# Patient Record
Sex: Female | Born: 1980 | Race: White | Hispanic: No | Smoking: Current every day smoker
Health system: Southern US, Community
[De-identification: ages and names within clinical notes are randomized; demographics above are authoritative.]

## PROBLEM LIST (undated history)

## (undated) DIAGNOSIS — M549 Dorsalgia, unspecified: Secondary | ICD-10-CM

## (undated) DIAGNOSIS — F419 Anxiety disorder, unspecified: Secondary | ICD-10-CM

## (undated) DIAGNOSIS — F319 Bipolar disorder, unspecified: Secondary | ICD-10-CM

## (undated) DIAGNOSIS — F329 Major depressive disorder, single episode, unspecified: Secondary | ICD-10-CM

## (undated) DIAGNOSIS — F32A Depression, unspecified: Secondary | ICD-10-CM

## (undated) DIAGNOSIS — E049 Nontoxic goiter, unspecified: Secondary | ICD-10-CM

## (undated) DIAGNOSIS — T1490XA Injury, unspecified, initial encounter: Secondary | ICD-10-CM

## (undated) DIAGNOSIS — M5126 Other intervertebral disc displacement, lumbar region: Secondary | ICD-10-CM

## (undated) DIAGNOSIS — Z3492 Encounter for supervision of normal pregnancy, unspecified, second trimester: Secondary | ICD-10-CM

## (undated) DIAGNOSIS — Z3009 Encounter for other general counseling and advice on contraception: Secondary | ICD-10-CM

## (undated) DIAGNOSIS — M797 Fibromyalgia: Secondary | ICD-10-CM

## (undated) DIAGNOSIS — M1990 Unspecified osteoarthritis, unspecified site: Secondary | ICD-10-CM

## (undated) HISTORY — DX: Nontoxic goiter, unspecified: E04.9

## (undated) HISTORY — DX: Encounter for supervision of normal pregnancy, unspecified, second trimester: Z34.92

## (undated) HISTORY — DX: Other intervertebral disc displacement, lumbar region: M51.26

## (undated) HISTORY — PX: TONSILLECTOMY: SUR1361

## (undated) HISTORY — DX: Dorsalgia, unspecified: M54.9

## (undated) HISTORY — DX: Injury, unspecified, initial encounter: T14.90XA

## (undated) HISTORY — DX: Major depressive disorder, single episode, unspecified: F32.9

## (undated) HISTORY — DX: Fibromyalgia: M79.7

## (undated) HISTORY — DX: Depression, unspecified: F32.A

## (undated) HISTORY — DX: Anxiety disorder, unspecified: F41.9

## (undated) HISTORY — DX: Encounter for other general counseling and advice on contraception: Z30.09

## (undated) HISTORY — DX: Bipolar disorder, unspecified: F31.9

---

## 2003-03-01 ENCOUNTER — Emergency Department (HOSPITAL_COMMUNITY): Admission: EM | Admit: 2003-03-01 | Discharge: 2003-03-01 | Payer: Self-pay | Admitting: Emergency Medicine

## 2003-04-05 ENCOUNTER — Emergency Department (HOSPITAL_COMMUNITY): Admission: EM | Admit: 2003-04-05 | Discharge: 2003-04-05 | Payer: Self-pay | Admitting: Emergency Medicine

## 2003-11-16 ENCOUNTER — Emergency Department (HOSPITAL_COMMUNITY): Admission: EM | Admit: 2003-11-16 | Discharge: 2003-11-16 | Payer: Self-pay | Admitting: Emergency Medicine

## 2004-05-21 ENCOUNTER — Emergency Department (HOSPITAL_COMMUNITY): Admission: EM | Admit: 2004-05-21 | Discharge: 2004-05-21 | Payer: Self-pay | Admitting: Emergency Medicine

## 2004-09-29 ENCOUNTER — Ambulatory Visit (HOSPITAL_COMMUNITY): Admission: RE | Admit: 2004-09-29 | Discharge: 2004-09-29 | Payer: Self-pay | Admitting: *Deleted

## 2005-04-03 ENCOUNTER — Emergency Department (HOSPITAL_COMMUNITY): Admission: EM | Admit: 2005-04-03 | Discharge: 2005-04-03 | Payer: Self-pay | Admitting: Emergency Medicine

## 2006-06-28 ENCOUNTER — Emergency Department (HOSPITAL_COMMUNITY): Admission: EM | Admit: 2006-06-28 | Discharge: 2006-06-29 | Payer: Self-pay | Admitting: Emergency Medicine

## 2006-10-14 ENCOUNTER — Emergency Department (HOSPITAL_COMMUNITY): Admission: EM | Admit: 2006-10-14 | Discharge: 2006-10-14 | Payer: Self-pay | Admitting: Emergency Medicine

## 2007-02-21 ENCOUNTER — Emergency Department (HOSPITAL_COMMUNITY): Admission: EM | Admit: 2007-02-21 | Discharge: 2007-02-21 | Payer: Self-pay | Admitting: Emergency Medicine

## 2007-06-24 ENCOUNTER — Emergency Department (HOSPITAL_COMMUNITY): Admission: EM | Admit: 2007-06-24 | Discharge: 2007-06-24 | Payer: Self-pay | Admitting: *Deleted

## 2007-10-09 ENCOUNTER — Emergency Department (HOSPITAL_COMMUNITY): Admission: EM | Admit: 2007-10-09 | Discharge: 2007-10-09 | Payer: Self-pay | Admitting: Emergency Medicine

## 2008-02-19 ENCOUNTER — Emergency Department (HOSPITAL_COMMUNITY): Admission: EM | Admit: 2008-02-19 | Discharge: 2008-02-20 | Payer: Self-pay | Admitting: Emergency Medicine

## 2008-05-08 ENCOUNTER — Encounter: Admission: RE | Admit: 2008-05-08 | Discharge: 2008-07-06 | Payer: Self-pay | Admitting: Orthopedic Surgery

## 2008-05-30 ENCOUNTER — Emergency Department (HOSPITAL_COMMUNITY): Admission: EM | Admit: 2008-05-30 | Discharge: 2008-05-30 | Payer: Self-pay | Admitting: Emergency Medicine

## 2008-09-08 ENCOUNTER — Emergency Department (HOSPITAL_COMMUNITY): Admission: EM | Admit: 2008-09-08 | Discharge: 2008-09-08 | Payer: Self-pay | Admitting: Emergency Medicine

## 2008-09-11 ENCOUNTER — Emergency Department (HOSPITAL_COMMUNITY): Admission: EM | Admit: 2008-09-11 | Discharge: 2008-09-11 | Payer: Self-pay | Admitting: Emergency Medicine

## 2009-10-19 DIAGNOSIS — T1490XA Injury, unspecified, initial encounter: Secondary | ICD-10-CM

## 2009-10-19 HISTORY — DX: Injury, unspecified, initial encounter: T14.90XA

## 2009-11-06 ENCOUNTER — Emergency Department (HOSPITAL_COMMUNITY): Admission: EM | Admit: 2009-11-06 | Discharge: 2009-11-06 | Payer: Self-pay | Admitting: Emergency Medicine

## 2009-11-09 ENCOUNTER — Encounter (HOSPITAL_COMMUNITY): Admission: RE | Admit: 2009-11-09 | Discharge: 2009-12-09 | Payer: Self-pay | Admitting: Orthopaedic Surgery

## 2009-12-01 ENCOUNTER — Ambulatory Visit (HOSPITAL_COMMUNITY): Admission: RE | Admit: 2009-12-01 | Discharge: 2009-12-01 | Payer: Self-pay | Admitting: Family Medicine

## 2010-03-08 ENCOUNTER — Encounter (HOSPITAL_COMMUNITY)
Admission: RE | Admit: 2010-03-08 | Discharge: 2010-04-07 | Payer: Self-pay | Source: Home / Self Care | Admitting: Orthopaedic Surgery

## 2010-09-05 ENCOUNTER — Ambulatory Visit (HOSPITAL_COMMUNITY)
Admission: RE | Admit: 2010-09-05 | Discharge: 2010-09-05 | Payer: Self-pay | Source: Home / Self Care | Attending: Family Medicine | Admitting: Family Medicine

## 2011-04-15 ENCOUNTER — Emergency Department (HOSPITAL_COMMUNITY)

## 2011-04-15 ENCOUNTER — Emergency Department (HOSPITAL_COMMUNITY)
Admission: EM | Admit: 2011-04-15 | Discharge: 2011-04-15 | Disposition: A | Discharge status: Home / Self Care | Attending: Emergency Medicine | Admitting: Emergency Medicine

## 2011-04-15 ENCOUNTER — Encounter: Payer: Self-pay | Admitting: Emergency Medicine

## 2011-04-15 DIAGNOSIS — F17200 Nicotine dependence, unspecified, uncomplicated: Secondary | ICD-10-CM | POA: Insufficient documentation

## 2011-04-15 DIAGNOSIS — S93409A Sprain of unspecified ligament of unspecified ankle, initial encounter: Secondary | ICD-10-CM

## 2011-04-15 DIAGNOSIS — X500XXA Overexertion from strenuous movement or load, initial encounter: Secondary | ICD-10-CM | POA: Insufficient documentation

## 2011-04-15 HISTORY — DX: Unspecified osteoarthritis, unspecified site: M19.90

## 2011-04-15 NOTE — ED Provider Notes (Signed)
Evaluation and management procedures were performed by the mid-level provider (PA/NP/CNM) under my supervision/collaboration. I was present and available during the ED course.  Gavin Pound. Oletta Lamas, MD 04/15/11 2046

## 2011-04-15 NOTE — ED Provider Notes (Signed)
History     CSN: [id number] Arrival date & time: 04/15/2011  6:25 PM  Chief Complaint  Patient presents with  . Ankle Pain   Patient is a 30 y.o. female presenting with ankle pain. The history is provided by the patient. No language interpreter was used.  Ankle Pain  The incident occurred 3 to 5 hours ago. Incident location: at a park. Injury mechanism: inversion. The pain is present in the right ankle. The quality of the pain is described as sharp. The pain is moderate. The pain has been constant since onset. Pertinent negatives include no inability to bear weight.    Past Medical History  Diagnosis Date  . Arthritis     Past Surgical History  Procedure Date  . Tonsillectomy     Family History  Problem Relation Age of Onset  . Osteoarthritis Mother   . Heart failure Mother   . Hyperlipidemia Mother   . Hypertension Mother   . Migraines Mother   . Emphysema Other   . Cancer Other   . Diabetes Other     History  Substance Use Topics  . Smoking status: Current Everyday Smoker -- 1.5 packs/day    Types: Cigarettes  . Smokeless tobacco: Never Used  . Alcohol Use: No    OB History    Grav Para Term Preterm Abortions TAB SAB Ect Mult Living   3 2 2  1 1    2       Review of Systems  Musculoskeletal:       Ankle pain    Physical Exam  BP 102/61  Pulse 89  Temp(Src) 98.2 F (36.8 C) (Oral)  Resp 14  Ht 5\' 4"  (1.626 m)  Wt 130 lb 1.6 oz (59.013 kg)  BMI 22.33 kg/m2  SpO2 100%  LMP 03/27/2011  Physical Exam  Nursing note and vitals reviewed. Constitutional: She is oriented to person, place, and time. Vital signs are normal. She appears well-developed and well-nourished. No distress.  HENT:  Head: Normocephalic and atraumatic.  Right Ear: External ear normal.  Left Ear: External ear normal.  Nose: Nose normal.  Mouth/Throat: No oropharyngeal exudate.  Eyes: Conjunctivae and EOM are normal. Pupils are equal, round, and reactive to light. Right eye  exhibits no discharge. Left eye exhibits no discharge. No scleral icterus.  Neck: Normal range of motion. Neck supple. No JVD present. No tracheal deviation present. No thyromegaly present.  Cardiovascular: Normal rate, regular rhythm, normal heart sounds, intact distal pulses and normal pulses.  Exam reveals no gallop and no friction rub.   No murmur heard. Pulmonary/Chest: Effort normal and breath sounds normal. No stridor. No respiratory distress. She has no wheezes. She has no rales. She exhibits no tenderness.  Abdominal: Soft. Normal appearance and bowel sounds are normal. She exhibits no distension and no mass. There is no tenderness. There is no rebound and no guarding.  Musculoskeletal: Normal range of motion. She exhibits tenderness. She exhibits no edema.       Feet:  Lymphadenopathy:    She has no cervical adenopathy.  Neurological: She is alert and oriented to person, place, and time. She has normal reflexes. No cranial nerve deficit. Coordination normal. GCS eye subscore is 4. GCS verbal subscore is 5. GCS motor subscore is 6.  Skin: Skin is warm and dry. No rash noted. She is not diaphoretic.  Psychiatric: She has a normal mood and affect. Her speech is normal and behavior is normal. Judgment and thought content normal. Cognition  and memory are normal.    ED Course  Procedures  MDM       Worthy Rancher, [place] 04/15/11 2039
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
## 2011-04-15 NOTE — ED Notes (Signed)
ASo ankle splint applied to R ankle

## 2011-05-12 LAB — URINALYSIS, ROUTINE W REFLEX MICROSCOPIC
Glucose, UA: NEGATIVE
Ketones, ur: NEGATIVE
Nitrite: NEGATIVE
Protein, ur: NEGATIVE

## 2011-05-12 LAB — RAPID STREP SCREEN (MED CTR MEBANE ONLY): Streptococcus, Group A Screen (Direct): NEGATIVE

## 2011-05-12 LAB — INFLUENZA A+B VIRUS AG-DIRECT(RAPID)
Inflenza A Ag: NEGATIVE
Influenza B Ag: NEGATIVE

## 2011-05-18 LAB — URINALYSIS, ROUTINE W REFLEX MICROSCOPIC
Bilirubin Urine: NEGATIVE
Glucose, UA: NEGATIVE
Hgb urine dipstick: NEGATIVE
Specific Gravity, Urine: 1.009
pH: 7

## 2011-10-11 ENCOUNTER — Ambulatory Visit (INDEPENDENT_AMBULATORY_CARE_PROVIDER_SITE_OTHER): Admitting: Psychiatry

## 2011-10-11 ENCOUNTER — Encounter (HOSPITAL_COMMUNITY): Payer: Self-pay | Admitting: Psychiatry

## 2011-10-11 VITALS — BP 114/62 | HR 80 | Wt 130.4 lb

## 2011-10-11 DIAGNOSIS — F3289 Other specified depressive episodes: Secondary | ICD-10-CM

## 2011-10-11 DIAGNOSIS — F32A Depression, unspecified: Secondary | ICD-10-CM | POA: Insufficient documentation

## 2011-10-11 DIAGNOSIS — F329 Major depressive disorder, single episode, unspecified: Secondary | ICD-10-CM | POA: Insufficient documentation

## 2011-10-11 MED ORDER — FLUOXETINE HCL 10 MG PO CAPS: 10.0000 mg | ORAL_CAPSULE | Freq: Every day | ORAL | Status: DC
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
## 2011-10-17 ENCOUNTER — Encounter (HOSPITAL_COMMUNITY): Payer: Self-pay | Admitting: Licensed Clinical Social Worker

## 2011-10-17 ENCOUNTER — Ambulatory Visit (INDEPENDENT_AMBULATORY_CARE_PROVIDER_SITE_OTHER): Admitting: Licensed Clinical Social Worker

## 2011-10-17 DIAGNOSIS — F332 Major depressive disorder, recurrent severe without psychotic features: Secondary | ICD-10-CM

## 2011-10-18 ENCOUNTER — Encounter (HOSPITAL_COMMUNITY): Payer: Self-pay | Admitting: Licensed Clinical Social Worker
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
## 2011-10-24 ENCOUNTER — Ambulatory Visit (INDEPENDENT_AMBULATORY_CARE_PROVIDER_SITE_OTHER): Admitting: Licensed Clinical Social Worker

## 2011-10-24 DIAGNOSIS — F332 Major depressive disorder, recurrent severe without psychotic features: Secondary | ICD-10-CM
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
## 2011-10-27 ENCOUNTER — Ambulatory Visit (INDEPENDENT_AMBULATORY_CARE_PROVIDER_SITE_OTHER): Admitting: Psychiatry

## 2011-10-27 ENCOUNTER — Encounter (HOSPITAL_COMMUNITY): Payer: Self-pay | Admitting: Psychiatry

## 2011-10-27 VITALS — Wt 134.0 lb

## 2011-10-27 DIAGNOSIS — F419 Anxiety disorder, unspecified: Secondary | ICD-10-CM

## 2011-10-27 DIAGNOSIS — F329 Major depressive disorder, single episode, unspecified: Secondary | ICD-10-CM

## 2011-10-27 DIAGNOSIS — F411 Generalized anxiety disorder: Secondary | ICD-10-CM

## 2011-10-27 MED ORDER — FLUOXETINE HCL 10 MG PO CAPS: 10.0000 mg | ORAL_CAPSULE | Freq: Every day | ORAL | Status: DC

## 2011-10-27 MED ORDER — LORAZEPAM 0.5 MG PO TABS: 0.5000 mg | ORAL_TABLET | ORAL | Status: DC | PRN
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
## 2011-10-30 ENCOUNTER — Encounter (HOSPITAL_COMMUNITY): Payer: Self-pay | Admitting: Licensed Clinical Social Worker

## 2011-10-30 ENCOUNTER — Ambulatory Visit (INDEPENDENT_AMBULATORY_CARE_PROVIDER_SITE_OTHER): Admitting: Licensed Clinical Social Worker

## 2011-10-30 DIAGNOSIS — F332 Major depressive disorder, recurrent severe without psychotic features: Secondary | ICD-10-CM

## 2011-10-30 DIAGNOSIS — F410 Panic disorder [episodic paroxysmal anxiety] without agoraphobia: Secondary | ICD-10-CM

## 2011-10-30 DIAGNOSIS — F411 Generalized anxiety disorder: Secondary | ICD-10-CM

## 2011-10-30 NOTE — Progress Notes (Signed)
   THERAPIST PROGRESS NOTE  Session Time: 9:30am-10:20am  Participation Level: Active  Behavioral Response: Well GroomedAlertAnxious  Type of Therapy: Individual Therapy  Treatment Goals addressed: Anxiety and Coping  Interventions: CBT, DBT, Strength-based and Supportive  Summary: Gina Barron is a 30 y.o. female who presents with anxious mood and affect. She reports having a stressful week, which she says is the norm for her right now. She describes frequent panic attacks and reports that when she has them in her home, she locks herself in a room and smokes to deal with the stress. When she is driving, she pulls over and when she is out in public in a store, she immediately leaves. She has obssessive and racing thoughts and describes OCD behaviors such as checking locks on doors, windows and knobs on the stove each night. She reports that these symptoms have been around "for a long time" and she has adapted to them. She must clean in a very specific order and if she is interrupted, she must start at the beginning again, even if she has already cleaned those rooms. Her anxiety causes her the most difficulty and has been generalized historically, but is now focused on the situation with her family. Her sleep is slowly improving and her appetite is wnl.    Suicidal/Homicidal: Nowithout intent/plan  Therapist Response: Processed w/pt her feelings related to her family situation. Assisted her with insight development into her triggers for panic attacks. She struggles to identify specific triggers. Used deep breathing techniques to decrease her anxiety and taught her to do this when she has a panic attack. Provided reframing for her irrational thought processes. Used CBT to assist patient with the identification of her negative distortions and irrational thoughts. Encouraged patient to verbalize alternative and factual responses which challenge her distortions. Encouraged journal keeping, but  she is still uncertain where she can keep the journal without her daughters finding it. Reviewed patients self care plan. Assessed her progress related to self care. Patient's self care is fair. Recommend proper diet, regular exercise, socialization and recreation.   Plan: Return again in one weeks.  Diagnosis: Axis I: Generalized Anxiety Disorder, Major Depression, Recurrent severe and Panic Disorder    Axis II: No diagnosis    Keaundre Thelin, LCSW 10/30/2011

## 2011-11-13 ENCOUNTER — Ambulatory Visit (INDEPENDENT_AMBULATORY_CARE_PROVIDER_SITE_OTHER): Admitting: Licensed Clinical Social Worker

## 2011-11-13 ENCOUNTER — Encounter (HOSPITAL_COMMUNITY): Payer: Self-pay | Admitting: Licensed Clinical Social Worker

## 2011-11-13 DIAGNOSIS — F411 Generalized anxiety disorder: Secondary | ICD-10-CM

## 2011-11-13 DIAGNOSIS — F332 Major depressive disorder, recurrent severe without psychotic features: Secondary | ICD-10-CM

## 2011-11-13 DIAGNOSIS — F319 Bipolar disorder, unspecified: Secondary | ICD-10-CM | POA: Insufficient documentation
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
## 2011-11-15 ENCOUNTER — Ambulatory Visit (INDEPENDENT_AMBULATORY_CARE_PROVIDER_SITE_OTHER): Admitting: Psychiatry

## 2011-11-15 ENCOUNTER — Encounter (HOSPITAL_COMMUNITY): Payer: Self-pay | Admitting: Psychiatry

## 2011-11-15 VITALS — BP 116/71 | HR 75 | Wt 136.0 lb

## 2011-11-15 DIAGNOSIS — F419 Anxiety disorder, unspecified: Secondary | ICD-10-CM

## 2011-11-15 DIAGNOSIS — F411 Generalized anxiety disorder: Secondary | ICD-10-CM

## 2011-11-15 DIAGNOSIS — F329 Major depressive disorder, single episode, unspecified: Secondary | ICD-10-CM

## 2011-11-15 MED ORDER — LORAZEPAM 0.5 MG PO TABS: 0.5000 mg | ORAL_TABLET | ORAL | Status: DC | PRN

## 2011-11-15 MED ORDER — FLUOXETINE HCL 10 MG PO CAPS: 10.0000 mg | ORAL_CAPSULE | Freq: Two times a day (BID) | ORAL | Status: DC
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
## 2011-11-28 ENCOUNTER — Ambulatory Visit (INDEPENDENT_AMBULATORY_CARE_PROVIDER_SITE_OTHER): Admitting: Licensed Clinical Social Worker

## 2011-11-28 DIAGNOSIS — F329 Major depressive disorder, single episode, unspecified: Secondary | ICD-10-CM

## 2011-11-28 DIAGNOSIS — F411 Generalized anxiety disorder: Secondary | ICD-10-CM
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
## 2011-11-29 ENCOUNTER — Encounter (HOSPITAL_COMMUNITY): Payer: Self-pay | Admitting: *Deleted

## 2011-11-29 NOTE — Progress Notes (Signed)
Patient states anxiety and panic attacks are worse and medication is not helping as much.

## 2011-11-29 NOTE — Progress Notes (Signed)
Needs to be seen. Rec to increase prozac to 30 mg

## 2011-11-30 ENCOUNTER — Telehealth (HOSPITAL_COMMUNITY): Payer: Self-pay | Admitting: *Deleted

## 2011-11-30 NOTE — Telephone Encounter (Signed)
Instructed patient in message to increase Prozac 10 mg to 3 capsules daily and that she could take Ativan 0.5 mg twice daily if needed per Dr.Arfeen's orders.Instructed to call office as needed.Informed patient that will let Belenda Cruise know.

## 2011-12-01 ENCOUNTER — Encounter (HOSPITAL_COMMUNITY): Payer: Self-pay | Admitting: Emergency Medicine

## 2011-12-01 ENCOUNTER — Emergency Department (HOSPITAL_COMMUNITY)

## 2011-12-01 ENCOUNTER — Emergency Department (HOSPITAL_COMMUNITY)
Admission: EM | Admit: 2011-12-01 | Discharge: 2011-12-01 | Disposition: A | Discharge status: Home / Self Care | Attending: Emergency Medicine | Admitting: Emergency Medicine

## 2011-12-01 DIAGNOSIS — M6740 Ganglion, unspecified site: Secondary | ICD-10-CM

## 2011-12-01 DIAGNOSIS — M79609 Pain in unspecified limb: Secondary | ICD-10-CM | POA: Insufficient documentation

## 2011-12-01 DIAGNOSIS — F17200 Nicotine dependence, unspecified, uncomplicated: Secondary | ICD-10-CM | POA: Insufficient documentation

## 2011-12-01 NOTE — ED Notes (Signed)
Pt c/o left hand pain since Monday. Pt denies any injury to that hand.

## 2011-12-01 NOTE — ED Notes (Signed)
Pt states awoke with had swollen and painful with movement. Pt states has a past history of painful swelling. Pt has a noted swelling on top of left hand. Pt denies injury/trauma. Pt has self treated at home without success. X-ray completed and resulted.

## 2011-12-01 NOTE — ED Provider Notes (Signed)
Medical screening examination/treatment/procedure(s) were performed by non-physician practitioner and as supervising physician I was immediately available for consultation/collaboration.  Shelda Jakes, MD 12/01/11 [phone]

## 2011-12-01 NOTE — ED Provider Notes (Signed)
History     CSN: [id number]  Arrival date & time 12/01/11  1721   First MD Initiated Contact with Patient 12/01/11 1922      Chief Complaint  Patient presents with  . Hand Pain    (Consider location/radiation/quality/duration/timing/severity/associated sxs/prior treatment) HPI Comments: Waxing and waning of area on L dorsal wrist.  No known trauma.  Pain with movement of wrist.  Patient is a 31 y.o. female presenting with hand pain. The history is provided by the patient. No language interpreter was used.  Hand Pain This is a new problem. The problem occurs constantly. Exacerbated by: wrist movement and palpation. She has tried NSAIDs for the symptoms. The treatment provided mild relief.    Past Medical History  Diagnosis Date  . Arthritis     Past Surgical History  Procedure Date  . Tonsillectomy     Family History  Problem Relation Age of Onset  . Osteoarthritis Mother   . Heart failure Mother   . Hyperlipidemia Mother   . Hypertension Mother   . Migraines Mother   . Alcohol abuse Mother   . Drug abuse Mother   . Depression Mother   . Emphysema Other   . Cancer Other   . Diabetes Other   . Alcohol abuse Father   . Drug abuse Father   . Depression Father   . Alcohol abuse Sister   . Drug abuse Sister   . Depression Sister   . Depression Maternal Grandmother     History  Substance Use Topics  . Smoking status: Current Everyday Smoker -- 1.5 packs/day    Types: Cigarettes  . Smokeless tobacco: Never Used  . Alcohol Use: No    OB History    Grav Para Term Preterm Abortions TAB SAB Ect Mult Living   3 2 2  1 1    2       Review of Systems  Skin:       Wrist pain and swelling    All other systems reviewed and are negative.    Allergies  Latex  Home Medications   Current Outpatient Rx  Name Route Sig Dispense Refill  . DICLOFENAC SODIUM 1 % TD GEL Topical Apply 1 application topically as needed. For pain    . FLUOXETINE HCL 10 MG PO CAPS  Oral Take 10 mg by mouth 3 (three) times daily.    Marland Kitchen HYDROCORTISONE 2.5 % EX CREA Topical Apply 1 application topically daily as needed.    Marland Kitchen LORAZEPAM 0.5 MG PO TABS Oral Take 0.5 mg by mouth 2 (two) times daily.    Marland Kitchen NAPROXEN SODIUM 220 MG PO TABS Oral Take 220 mg by mouth 2 (two) times daily as needed. Arthritis  pain       BP 108/69  Pulse 67  Temp(Src) 98.2 F (36.8 C) (Oral)  Resp 18  Ht 5\' 4"  (1.626 m)  Wt 136 lb (61.689 kg)  BMI 23.34 kg/m2  SpO2 100%  LMP 11/30/2011  Physical Exam  Nursing note and vitals reviewed. Constitutional: She is oriented to person, place, and time. She appears well-developed and well-nourished. No distress.  HENT:  Head: Normocephalic and atraumatic.  Eyes: EOM are normal.  Neck: Normal range of motion.  Cardiovascular: Normal rate, regular rhythm and normal heart sounds.   Pulmonary/Chest: Effort normal and breath sounds normal.  Abdominal: Soft. She exhibits no distension. There is no tenderness.  Musculoskeletal: She exhibits tenderness.       Left wrist: She exhibits  decreased range of motion and swelling. She exhibits no tenderness, no bony tenderness, no crepitus, no deformity and no laceration.       Soft fluid-filled area ~ 3 cm in diam to L dorsal wrist c/w ganglion cyst.    Neurological: She is alert and oriented to person, place, and time.  Skin: Skin is warm and dry.  Psychiatric: She has a normal mood and affect. Judgment normal.    ED Course  Procedures (including critical care time)  Labs Reviewed - No data to display Dg Hand Complete Left  12/01/2011  *RADIOLOGY REPORT*  Clinical Data: Left hand pain.  Palpable knot dorsum of the hand. History of arthritis.  LEFT HAND - COMPLETE 3+ VIEW  Comparison: Wrist radiographs 10/03/2009 and MRI 12/01/2009.  Findings: The mineralization and alignment are normal.  There is no evidence of acute fracture or dislocation.  No focal soft tissue swelling is evident.  There is no apparent  erosive change.  IMPRESSION: No acute osseous findings or focal soft tissue abnormality demonstrated.  Original Report Authenticated By: Gerrianne Scale, M.D.     1. Ganglion cyst       MDM  Wrist splint Ice Ibuprofen F/u with dr. Thelma Barge, PA 12/01/11 2037  Worthy Rancher, [place] 12/01/11 2041

## 2011-12-01 NOTE — ED Notes (Signed)
Pt DC to home with steady gait 

## 2011-12-01 NOTE — Discharge Instructions (Signed)
Ganglion A ganglion is a swelling under the skin that is filled with a thick, jelly-like substance. It is a synovial cyst. This is caused by a break (rupture) of the joint lining from the joint space. A ganglion often occurs near an area of repeated minor trauma (damage caused by an accident). Trauma may also be a repetitive movement at work or in a sport. TREATMENT  It often goes away without treatment. It may reappear later. Sometimes a ganglion may need to be surgically removed. Often they are drained and injected with a steroid. Sometimes they respond to:  Rest.   Splinting.  HOME CARE INSTRUCTIONS   Your caregiver will decide the best way of treating your ganglion. Do not try to break the ganglion yourself by pressing on it, poking it with a needle, or hitting it with a heavy object.   Use medications as directed.  SEEK MEDICAL CARE IF:   The ganglion becomes larger or more painful.   You have increased redness or swelling.   You have weakness or numbness in your hand or wrist.  MAKE SURE YOU:   Understand these instructions.   Will watch your condition.   Will get help right away if you are not doing well or get worse.  Document Released: 08/04/2000 Document Revised: 07/27/2011 Document Reviewed: 10/01/2007 [facility] Patient Information 2012 [place], [place].Cryotherapy Cryotherapy means treatment with cold. Ice or gel packs can be used to reduce both pain and swelling. Ice is the most helpful within the first 24 to 48 hours after an injury or flareup from overusing a muscle or joint. Sprains, strains, spasms, burning pain, shooting pain, and aches can all be eased with ice. Ice can also be used when recovering from surgery. Ice is effective, has very few side effects, and is safe for most people to use. PRECAUTIONS  Ice is not a safe treatment option for people with:  Raynaud's phenomenon. This is a condition affecting small blood vessels in the extremities. Exposure to cold may  cause your problems to return.   Cold hypersensitivity. There are many forms of cold hypersensitivity, including:   Cold urticaria. Red, itchy hives appear on the skin when the tissues begin to warm after being iced.   Cold erythema. This is a red, itchy rash caused by exposure to cold.   Cold hemoglobinuria. Red blood cells break down when the tissues begin to warm after being iced. The hemoglobin that carry oxygen are passed into the urine because they cannot combine with blood proteins fast enough.   Numbness or altered sensitivity in the area being iced.  If you have any of the following conditions, do not use ice until you have discussed cryotherapy with your caregiver:  Heart conditions, such as arrhythmia, angina, or chronic heart disease.   High blood pressure.   Healing wounds or open skin in the area being iced.   Current infections.   Rheumatoid arthritis.   Poor circulation.   Diabetes.  Ice slows the blood flow in the region it is applied. This is beneficial when trying to stop inflamed tissues from spreading irritating chemicals to surrounding tissues. However, if you expose your skin to cold temperatures for too long or without the proper protection, you can damage your skin or nerves. Watch for signs of skin damage due to cold. HOME CARE INSTRUCTIONS Follow these tips to use ice and cold packs safely.  Place a dry or damp towel between the ice and skin. A damp towel will cool  the skin more quickly, so you may need to shorten the time that the ice is used.   For a more rapid response, add gentle compression to the ice.   Ice for no more than 10 to 20 minutes at a time. The bonier the area you are icing, the less time it will take to get the benefits of ice.   Check your skin after 5 minutes to make sure there are no signs of a poor response to cold or skin damage.   Rest 20 minutes or more in between uses.   Once your skin is numb, you can end your treatment.  You can test numbness by very lightly touching your skin. The touch should be so light that you do not see the skin dimple from the pressure of your fingertip. When using ice, most people will feel these normal sensations in this order: cold, burning, aching, and numbness.   Do not use ice on someone who cannot communicate their responses to pain, such as small children or people with dementia.  HOW TO MAKE AN ICE PACK Ice packs are the most common way to use ice therapy. Other methods include ice massage, ice baths, and cryo-sprays. Muscle creams that cause a cold, tingly feeling do not offer the same benefits that ice offers and should not be used as a substitute unless recommended by your caregiver. To make an ice pack, do one of the following:  Place crushed ice or a bag of frozen vegetables in a sealable plastic bag. Squeeze out the excess air. Place this bag inside another plastic bag. Slide the bag into a pillowcase or place a damp towel between your skin and the bag.   Mix 3 parts water with 1 part rubbing alcohol. Freeze the mixture in a sealable plastic bag. When you remove the mixture from the freezer, it will be slushy. Squeeze out the excess air. Place this bag inside another plastic bag. Slide the bag into a pillowcase or place a damp towel between your skin and the bag.  SEEK MEDICAL CARE IF:  You develop white spots on your skin. This may give the skin a blotchy (mottled) appearance.   Your skin turns blue or pale.   Your skin becomes waxy or hard.   Your swelling gets worse.  MAKE SURE YOU:   Understand these instructions.   Will watch your condition.   Will get help right away if you are not doing well or get worse.  Document Released: 04/03/2011 Document Revised: 07/27/2011 Document Reviewed: 04/03/2011 [facility] Patient Information 2012 [place], [place].   Wear the splint for comfort.  Take ibuprofen every 8 hrs with food.  Apply ice several times daily.  Follow up  with dr. Hilda Lias.

## 2011-12-05 ENCOUNTER — Ambulatory Visit (INDEPENDENT_AMBULATORY_CARE_PROVIDER_SITE_OTHER): Admitting: Licensed Clinical Social Worker

## 2011-12-05 DIAGNOSIS — F410 Panic disorder [episodic paroxysmal anxiety] without agoraphobia: Secondary | ICD-10-CM

## 2011-12-05 DIAGNOSIS — F411 Generalized anxiety disorder: Secondary | ICD-10-CM
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
## 2011-12-12 ENCOUNTER — Ambulatory Visit (INDEPENDENT_AMBULATORY_CARE_PROVIDER_SITE_OTHER): Admitting: Licensed Clinical Social Worker

## 2011-12-12 DIAGNOSIS — F411 Generalized anxiety disorder: Secondary | ICD-10-CM

## 2011-12-12 DIAGNOSIS — F410 Panic disorder [episodic paroxysmal anxiety] without agoraphobia: Secondary | ICD-10-CM

## 2011-12-12 NOTE — Progress Notes (Signed)
   THERAPIST PROGRESS NOTE  Session Time: 9:30am-10:20am  Participation Level: Active  Behavioral Response: Well GroomedAlertAnxious and Depressed  Type of Therapy: Family Therapy  Treatment Goals addressed: Coping  Interventions: CBT, Strength-based, Supportive, Family Systems and Reframing  Summary: Gina Barron is a 31 y.o. female who presents with depressed/anxious mood and anxious affect. She has brought her husband with her today so he can provide further information about her since she says she feels like she can't always communicate about herself well. She reports increased feelings of depression since starting Prozac and has cut down on her dose to once per day. She has discontinued Lorzaepam because it caused to much drowsiness. She feels less motivated and is using video games to avoid her feelings up to 16 hours per day. Her husband expresses concern that his wife has become more depressed since starting anti-depressants. He is concerned about her angry outbursts without warning and tries to calm her down, but it doesn't work. Patients sleep is improving with the use of Melatonin and her appetite in wnl.   Suicidal/Homicidal: Nowithout intent/plan  Therapist Response: Reviewed patients progress and assessed her current functioning. Recommend patient discuss her medication concerns with Dr. Lolly Mustache at her appointment this week. Explored triggering events leading up to angry outbursts and early warning signs. Provided patient with a mood chart to track patterns and recommend she begin to journal now that her daughter is out of the home. Explored how her husband can support efforts to identify her feelings and use strategies to manage her anger. Assisted patient with expression of her feelings, instead of facts. Used motivational interviewing to assist and encourage patient through the change process. Explored patients barriers to change. Used CBT to assist patient with the  identification of her negative distortions and irrational thoughts. Encouraged patient to verbalize alternative and factual responses which challenge her distortions. Reviewed patients self care plan. Assessed her progress related to self care. Patient's self care is fair. Recommend proper diet, regular exercise, socialization and recreation.   Plan: Return again in one weeks.  Diagnosis: Axis I: Generalized Anxiety Disorder    Axis II: No diagnosis    Henslee Lottman, LCSW 12/12/2011

## 2011-12-15 ENCOUNTER — Ambulatory Visit (INDEPENDENT_AMBULATORY_CARE_PROVIDER_SITE_OTHER): Admitting: Psychiatry

## 2011-12-15 ENCOUNTER — Encounter (HOSPITAL_COMMUNITY): Payer: Self-pay | Admitting: Psychiatry

## 2011-12-15 VITALS — BP 114/73 | HR 78 | Ht 60.0 in | Wt 135.8 lb

## 2011-12-15 DIAGNOSIS — F329 Major depressive disorder, single episode, unspecified: Secondary | ICD-10-CM

## 2011-12-15 MED ORDER — VENLAFAXINE HCL 37.5 MG PO TABS: 37.5000 mg | ORAL_TABLET | Freq: Two times a day (BID) | ORAL | Status: DC
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
## 2011-12-20 ENCOUNTER — Encounter (HOSPITAL_COMMUNITY): Payer: Self-pay | Admitting: Licensed Clinical Social Worker

## 2011-12-20 ENCOUNTER — Ambulatory Visit (INDEPENDENT_AMBULATORY_CARE_PROVIDER_SITE_OTHER): Admitting: Licensed Clinical Social Worker

## 2011-12-20 DIAGNOSIS — F410 Panic disorder [episodic paroxysmal anxiety] without agoraphobia: Secondary | ICD-10-CM

## 2011-12-20 DIAGNOSIS — F411 Generalized anxiety disorder: Secondary | ICD-10-CM

## 2011-12-20 NOTE — Progress Notes (Signed)
   THERAPIST PROGRESS NOTE  Session Time: 9:30am-10:20am  Participation Level: Active  Behavioral Response: Well GroomedAlertAnxious  Type of Therapy: Individual Therapy  Treatment Goals addressed: Coping  Interventions: CBT and Reframing  Summary: Gina Barron is a 31 y.o. female who presents with anxious mood and affect. She has been taking Effexor since Friday and is not pleased with the side effects she is experiencing. She is nauseous and tried altering the time of day she takes it to see if this improved with food. She did not take the medication today and plans to stop this, but wants to talk to Dr. Lolly Mustache about this first. Will assist patient with communication to Dr. Lolly Mustache. She reports continued high anxiety, with panic attacks and describes a recent incident involving her daughter hurting her arm in soccer practice and how she responded. She went from "0-100" immediately and reports that her body was shaking as she was driving. She tries to calm herself, but can't and is frustrated by this. Her sleep is disrupted even further since starting Effexor. She is stressed about trying to make a decision about where to live and how to make the household work considering the restrictions regarding her daughter and her husband.    Suicidal/Homicidal: Nowithout intent/plan  Therapist Response: Reviewed patients progress and assessed her current functioning. Processed her experience and her reaction to her daughter hurting herself.  Assisted patient with insight development into her thoughts preceding her anxiety. Completed a thought record with her about her experience of paying bills and recieveing bills in the mail. She identifies feelinsg of anxiety, dread and fear related to this expeirnce, with high intensity of these emotions. Explored her automatic negative thoughts associated with this experience and she is able to identify two predominant thoughts. Explored her believability in  these thoughts and distortions. Assisted her with development of rational responses, with which she has great difficulty. Used CBT to assist patient with the identification of her negative distortions and irrational thoughts. Encouraged patient to verbalize alternative and factual responses which challenge her distortions. Assigned homework using thought records. Used DBT to help patient build mindfulness and distress tolerance. Reviewed patients self care plan. Assessed her progress related to self care. Patient's self care is fair. Recommend proper diet, regular exercise, socialization and recreation.   Plan: Return again in one weeks.  Diagnosis: Axis I: Generalized Anxiety Disorder and Panic Disorder    Axis II: No diagnosis    Normajean Nash, LCSW 12/20/2011

## 2011-12-28 ENCOUNTER — Encounter (HOSPITAL_COMMUNITY): Payer: Self-pay | Admitting: Licensed Clinical Social Worker

## 2011-12-28 ENCOUNTER — Ambulatory Visit (INDEPENDENT_AMBULATORY_CARE_PROVIDER_SITE_OTHER): Admitting: Licensed Clinical Social Worker

## 2011-12-28 DIAGNOSIS — F3189 Other bipolar disorder: Secondary | ICD-10-CM

## 2011-12-28 DIAGNOSIS — F3181 Bipolar II disorder: Secondary | ICD-10-CM

## 2011-12-28 NOTE — Progress Notes (Signed)
   THERAPIST PROGRESS NOTE  Session Time: 9:30am-10:20am  Participation Level: Active  Behavioral Response: Well GroomedAlertAnxious and Irritable  Type of Therapy: Individual Therapy  Treatment Goals addressed: Coping  Interventions: CBT, Solution Focused, Strength-based, Supportive and Family Systems  Summary: Gina Barron is a 31 y.o. female who presents with hypomanic mood and anxious affect. She reports a mood swing resulting in a decreased need for sleep, increased spending, increased impulsivity, increased irritability and anxiety. She is restless and has been leaving the house just to go somewhere, like [place], which is unusual for her. She reports having alternating depressive and manic episodes throughout her adult life and that her job history is a direct reflection of this. She was fired from a job due to her inconsistency. She describes how her productivity was directly impacted by her mood lability. She describes being difficult to work with and being unable to control what she says when she is angry. Her husband has to move money around in their back accounts when she becomes hypomanic to protect their finances. She has stopped taking all medication given to her by Dr. Lolly Mustache because it was not working and will see him this week. Her sleep has decreased and her appetite is wnl.   Suicidal/Homicidal: Nowithout intent/plan  Therapist Response: Reviewed patients progress and assessed her current functioning. Explored patients history of mood laiblity. She presents with hypo-manic symptoms today. He speech is pressured and rapid, she is restless and anxious and has difficulty renaming on topic. Patient's current state and history indicate bi-polar 2 disorder. Will talk with Dr. Lolly Mustache about this regarding medication. Discussed this diagnoses with patient, who does not report that anyone else in her family has bi-polar disorder. Reviewed strategies to manage her hypomania,  enlisting the help of her husband. Used CBT to help patient realign her thoughts. Used CBT to assist patient with the identification of her negative distortions and irrational thoughts. Encouraged patient to verbalize alternative and factual responses which challenge her distortions. Reviewed patients self care plan. Assessed her progress related to self care. Patient's self care is fair. Recommend proper diet, regular exercise, socialization and recreation.   Plan: Return again in one weeks.  Diagnosis: Axis I: bi-polar 2    Axis II: No diagnosis    Rohini Jaroszewski, LCSW 12/28/2011

## 2012-01-03 ENCOUNTER — Ambulatory Visit (INDEPENDENT_AMBULATORY_CARE_PROVIDER_SITE_OTHER): Admitting: Licensed Clinical Social Worker

## 2012-01-03 ENCOUNTER — Encounter (HOSPITAL_COMMUNITY): Payer: Self-pay | Admitting: Licensed Clinical Social Worker

## 2012-01-03 DIAGNOSIS — F3189 Other bipolar disorder: Secondary | ICD-10-CM

## 2012-01-03 DIAGNOSIS — F3181 Bipolar II disorder: Secondary | ICD-10-CM

## 2012-01-03 NOTE — Progress Notes (Signed)
   THERAPIST PROGRESS NOTE  Session Time: 9:30am-10:20am  Participation Level: Active  Behavioral Response: Well GroomedAlertAnxious and Irritable  Type of Therapy: Individual Therapy  Treatment Goals addressed: Anxiety and Coping  Interventions: CBT, DBT, Motivational Interviewing, Strength-based and Reframing  Summary: Gina Barron is a 31 y.o. female who presents with anxious mood and affect. She reports continued anxiety, high degree of irritability, poor focus and impulsivity. She has asked her husband to take away her credit cards and to block her computer so she does not spend excessive amounts of money and does not spend her day playing games on the computer. She is frustrated with her husbands lack of involvement in decision making regarding the family. Their lease will run out at the end of this month and patient is uncertain what to do about this. She needs more assistance running the home and raising the children than she gets, but is hesitant to confront him and waits until her anxiety leads to a panic attack. Her sleep is light and disrupted and she is restless. Her appetite is wnl.   Suicidal/Homicidal: Nowithout intent/plan  Therapist Response: Reviewed patients progress and assessed her current functioning. She continues to use avoidance as her primary coping mechanism and demonstrates limited motivation and capacity to change this. Used motivational interviewing to assist and encourage patient through the change process. Explored patients barriers to change. She engages in codependent behavioral patterns, strong denial, justification and irrational thought patterns. Used CBT to assist patient with the identification of her negative distortions and irrational thoughts. Encouraged patient to verbalize alternative and factual responses which challenge her distortions. Reviewed patients self care plan. Assessed her progress related to self care. Patient's self care is poor.  Recommend proper diet, regular exercise, socialization and recreation.   Plan: Return again in one to two weeks.  Diagnosis: Axis I: bi-polar 2    Axis II: No diagnosis    Yentl Verge, LCSW 01/03/2012

## 2012-01-05 ENCOUNTER — Encounter (HOSPITAL_COMMUNITY): Payer: Self-pay | Admitting: Psychiatry

## 2012-01-05 ENCOUNTER — Ambulatory Visit (INDEPENDENT_AMBULATORY_CARE_PROVIDER_SITE_OTHER): Admitting: Psychiatry

## 2012-01-05 DIAGNOSIS — F329 Major depressive disorder, single episode, unspecified: Secondary | ICD-10-CM

## 2012-01-05 DIAGNOSIS — F3181 Bipolar II disorder: Secondary | ICD-10-CM

## 2012-01-05 DIAGNOSIS — F3189 Other bipolar disorder: Secondary | ICD-10-CM

## 2012-01-05 MED ORDER — DIVALPROEX SODIUM 125 MG PO DR TAB: 125.0000 mg | DELAYED_RELEASE_TABLET | Freq: Two times a day (BID) | ORAL | Status: DC
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
## 2012-01-09 ENCOUNTER — Ambulatory Visit (INDEPENDENT_AMBULATORY_CARE_PROVIDER_SITE_OTHER): Admitting: Licensed Clinical Social Worker

## 2012-01-09 DIAGNOSIS — F3189 Other bipolar disorder: Secondary | ICD-10-CM

## 2012-01-09 DIAGNOSIS — F3181 Bipolar II disorder: Secondary | ICD-10-CM

## 2012-01-10 NOTE — Progress Notes (Signed)
   THERAPIST PROGRESS NOTE  Session Time: 3:00pm-3:50pm  Participation Level: Active  Behavioral Response: Well GroomedAlertAnxious and Irritable  Type of Therapy: Individual Therapy  Treatment Goals addressed: Coping  Interventions: CBT, Strength-based and Reframing  Summary: Gina Barron is a 31 y.o. female who presents with hypomanic mood anxious/irritable affect. She reports no change in her hyopmania, anxiety and irritability since starting Depakote, but is hopeful because she is not experiencing any negative side effects. She reports racing thoughts, distractibility, frustration with her family and irritability. She tries to stay away from her husband and children because she is too irritable and realizes that she says things she shouldn't. She endorses high anxiety related to moving, but she lacks motivation to start packing. She continues to spend excessive amounts of time on the computer playing games in order to avoid her feelings. Her need for sleep has decreased in the past five days, needing only 3 hours. She does endorse feeling tired during the day. Her appetite remains wnl.   Suicidal/Homicidal: Nowithout intent/plan  Therapist Response: Reviewed patients progress and assessed her current functioning. Her speech is pressured and she is tangentail throughout the session. She has difficulty focusing and needs frequent redirection. Explored her lack of progress related to decreasing her time on the computer. Patient demonstrates limited insight and motivation to make changes to decrease her anxiety. Reviewed coping strategies. Used CBT to assist patient with the identification of her negative distortions and irrational thoughts. Encouraged patient to verbalize alternative and factual responses which challenge her distortions. Used motivational interviewing to assist and encourage patient through the change process. Explored patients barriers to change. Reviewed patients self  care plan. Assessed her progress related to self care. Patient's self care is fair. Recommend proper diet, regular exercise, socialization and recreation.   Plan: Return again in one weeks.  Diagnosis: Axis I: bi polar 2    Axis II: No diagnosis    Arlisa Leclere, LCSW 01/10/2012

## 2012-01-11 ENCOUNTER — Ambulatory Visit (HOSPITAL_COMMUNITY): Payer: Self-pay | Admitting: Licensed Clinical Social Worker

## 2012-01-19 ENCOUNTER — Encounter (HOSPITAL_COMMUNITY): Payer: Self-pay | Admitting: Psychiatry

## 2012-01-19 ENCOUNTER — Ambulatory Visit (INDEPENDENT_AMBULATORY_CARE_PROVIDER_SITE_OTHER): Admitting: Psychiatry

## 2012-01-19 VITALS — BP 122/70 | Wt 140.0 lb

## 2012-01-19 DIAGNOSIS — F3181 Bipolar II disorder: Secondary | ICD-10-CM

## 2012-01-19 DIAGNOSIS — F329 Major depressive disorder, single episode, unspecified: Secondary | ICD-10-CM

## 2012-01-19 DIAGNOSIS — F3189 Other bipolar disorder: Secondary | ICD-10-CM

## 2012-01-19 DIAGNOSIS — Z79899 Other long term (current) drug therapy: Secondary | ICD-10-CM

## 2012-01-19 MED ORDER — DIVALPROEX SODIUM 250 MG PO DR TAB: 250.0000 mg | DELAYED_RELEASE_TABLET | Freq: Two times a day (BID) | ORAL | Status: DC
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
## 2012-01-25 ENCOUNTER — Ambulatory Visit (INDEPENDENT_AMBULATORY_CARE_PROVIDER_SITE_OTHER): Admitting: Licensed Clinical Social Worker

## 2012-01-25 DIAGNOSIS — F3181 Bipolar II disorder: Secondary | ICD-10-CM

## 2012-01-25 DIAGNOSIS — F411 Generalized anxiety disorder: Secondary | ICD-10-CM

## 2012-01-25 DIAGNOSIS — F3189 Other bipolar disorder: Secondary | ICD-10-CM
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
## 2012-02-05 ENCOUNTER — Ambulatory Visit (HOSPITAL_COMMUNITY): Payer: Self-pay | Admitting: Licensed Clinical Social Worker

## 2012-02-05 ENCOUNTER — Telehealth (HOSPITAL_COMMUNITY): Payer: Self-pay | Admitting: *Deleted

## 2012-02-05 NOTE — Telephone Encounter (Signed)
Contacted patient.Patient states she is calling as directed regarding medications.States ok with Depakote, although she is not sure it is doing anything for her. Problem with panic attacks. If lorazepam is taken at dose given, she becomes nauseated and throws up within the hour. If she reduces the dose to half a tablet, it does not help at all with the panic attack.  Wants to know if there is something else she could try for the panic attacks.

## 2012-02-05 NOTE — Telephone Encounter (Signed)
Left VM: Please have Dr.Arfeen call her back

## 2012-02-06 ENCOUNTER — Other Ambulatory Visit (HOSPITAL_COMMUNITY): Payer: Self-pay | Admitting: Psychiatry

## 2012-02-06 DIAGNOSIS — F410 Panic disorder [episodic paroxysmal anxiety] without agoraphobia: Secondary | ICD-10-CM

## 2012-02-06 MED ORDER — CLONAZEPAM 0.5 MG PO TABS: 0.2500 mg | ORAL_TABLET | Freq: Every day | ORAL | Status: DC | PRN

## 2012-02-06 NOTE — Telephone Encounter (Signed)
Called returned.  Patient continues to have panic attack.  She tries Ativan however she feel very sleepy and sedated.  In the past she had tried Klonopin with fewer side effects.  She remembered taking Klonopin 0.25 mg.  I will retry Klonopin 0.25 mg for severe anxiety and panic attack.  I recommend to call us if this not work.  Patient is coming today with her child who has appointment with a therapist.  We will provide prescription of Klonopin.

## 2012-02-12 ENCOUNTER — Encounter (HOSPITAL_COMMUNITY): Payer: Self-pay | Admitting: Licensed Clinical Social Worker

## 2012-02-12 ENCOUNTER — Ambulatory Visit (INDEPENDENT_AMBULATORY_CARE_PROVIDER_SITE_OTHER): Admitting: Licensed Clinical Social Worker

## 2012-02-12 DIAGNOSIS — F411 Generalized anxiety disorder: Secondary | ICD-10-CM

## 2012-02-12 DIAGNOSIS — F3181 Bipolar II disorder: Secondary | ICD-10-CM

## 2012-02-12 DIAGNOSIS — F410 Panic disorder [episodic paroxysmal anxiety] without agoraphobia: Secondary | ICD-10-CM

## 2012-02-12 DIAGNOSIS — F3189 Other bipolar disorder: Secondary | ICD-10-CM

## 2012-02-12 NOTE — Progress Notes (Signed)
   THERAPIST PROGRESS NOTE  Session Time: 10:30am-11:20am  Participation Level: Active  Behavioral Response: Well GroomedAlertAnxious  Type of Therapy: Individual Therapy  Treatment Goals addressed: Coping  Interventions: CBT, Motivational Interviewing, Solution Focused, Supportive and Reframing  Summary: Gina Barron is a 31 y.o. female who presents with anxious mood and affect. She is tired because she is not sleeping well, only getting about four hours of sleep per night, falling asleep at 5am and getting up at 9am. She endorses racing thoughts keeping her awake. She stays in the bathroom all day playing games on her computer as a way to cope with her anxiety, panic and agoraphobia. She understands that this is not normal or healthy, but has anxiety because her youngest daughter has friends at her house every day. She is upset and frustrated with her husbands lack of attentiveness and motivation to do things around the home, to make responsible decisions and to alter some of his behavior which increases patients anxiety. Her appetite is wnl, with weight gain of 5 pounds since staring Depokate.   Suicidal/Homicidal: Nowithout intent/plan  Therapist Response: Reviewed patients progress and assessed current functioning. Patient has made progress related to using avoidance as her primary coping strategy. Her thinking is irrational at times, with negative self statements. Used CBT to assist patient with the identification of her negative distortions and irrational thoughts. Encouraged patient to verbalize alternative and factual responses which challenge her distortions. Processed patients feelings of frustration and anger with her husband. Practiced assertive communication with husband. Reviewed healthy boundaries. Used motivational interviewing to assist and encourage patient through the change process. Explored patients barriers to change. Reviewed patients self care plan. Assessed her  progress related to self care. Patient's self care is poor. Recommend proper diet, regular exercise, socialization and recreation.   Plan: Return again in one weeks.  Diagnosis: Axis I: Generalized Anxiety Disorder, Panic Disorder and bi polar 2    Axis II: No diagnosis    Reveca Desmarais, LCSW 02/12/2012

## 2012-02-19 ENCOUNTER — Ambulatory Visit (HOSPITAL_COMMUNITY): Payer: Self-pay | Admitting: Psychiatry

## 2012-02-21 ENCOUNTER — Ambulatory Visit (INDEPENDENT_AMBULATORY_CARE_PROVIDER_SITE_OTHER): Admitting: Licensed Clinical Social Worker

## 2012-02-21 DIAGNOSIS — F3181 Bipolar II disorder: Secondary | ICD-10-CM

## 2012-02-21 DIAGNOSIS — F411 Generalized anxiety disorder: Secondary | ICD-10-CM

## 2012-02-21 DIAGNOSIS — F410 Panic disorder [episodic paroxysmal anxiety] without agoraphobia: Secondary | ICD-10-CM

## 2012-02-21 DIAGNOSIS — F3189 Other bipolar disorder: Secondary | ICD-10-CM

## 2012-02-21 NOTE — Progress Notes (Signed)
   THERAPIST PROGRESS NOTE  Session Time: 2:00pm-2:50pm  Participation Level: Active  Behavioral Response: Well GroomedAlertAnxious  Type of Therapy: Individual Therapy  Treatment Goals addressed: Anxiety and Coping  Interventions: CBT, Motivational Interviewing, Supportive and Reframing  Summary: Gina Barron is a 31 y.o. female who presents with anxious mood and restless affect. She reports increased anxiety, restlessness and frustration. She has been trying to manage her restlessness by cleaning and rearranging furniture. She remains frustrated and angry with her husband. She remains in the bathroom on her computer as her main coping strategy. She feels as though she does not have control over her emotions and is unable to tolerate her anxiety. Her sleep is disrupted.   Suicidal/Homicidal: Nowithout intent/plan  Therapist Response: Processed w/ pt her feelings of frustration. Used CBT to assist patient with the identification of her negative distortions and irrational thoughts. Encouraged patient to verbalize alternative and factual responses which challenge her distortions. Used DBT to help patient build mindfulness and distress tolerance. Used motivational interviewing to assist and encourage patient through the change process. Explored patients barriers to change. Challenged patient to practice new coping strategies of thought re framing. Reviewed patients self care plan. Assessed her progress related to self care. Patient's self care is fair. Recommend proper diet, regular exercise, socialization and recreation.   Plan: Return again in two weeks.  Diagnosis: Axis I: bi polar 2    Axis II: No diagnosis    Lilliann Rossetti, LCSW 02/21/2012

## 2012-02-26 ENCOUNTER — Ambulatory Visit (INDEPENDENT_AMBULATORY_CARE_PROVIDER_SITE_OTHER): Admitting: Psychiatry

## 2012-02-26 ENCOUNTER — Encounter (HOSPITAL_COMMUNITY): Payer: Self-pay | Admitting: Psychiatry

## 2012-02-26 DIAGNOSIS — F329 Major depressive disorder, single episode, unspecified: Secondary | ICD-10-CM

## 2012-02-26 DIAGNOSIS — F3181 Bipolar II disorder: Secondary | ICD-10-CM

## 2012-02-26 DIAGNOSIS — F3189 Other bipolar disorder: Secondary | ICD-10-CM

## 2012-02-26 MED ORDER — DIVALPROEX SODIUM 250 MG PO DR TAB: DELAYED_RELEASE_TABLET | ORAL | Status: DC
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
## 2012-03-01 ENCOUNTER — Encounter (HOSPITAL_COMMUNITY): Payer: Self-pay | Admitting: Licensed Clinical Social Worker

## 2012-03-01 ENCOUNTER — Ambulatory Visit (INDEPENDENT_AMBULATORY_CARE_PROVIDER_SITE_OTHER): Admitting: Licensed Clinical Social Worker

## 2012-03-01 DIAGNOSIS — F3181 Bipolar II disorder: Secondary | ICD-10-CM

## 2012-03-01 DIAGNOSIS — F411 Generalized anxiety disorder: Secondary | ICD-10-CM

## 2012-03-01 DIAGNOSIS — F3189 Other bipolar disorder: Secondary | ICD-10-CM
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
## 2012-03-07 ENCOUNTER — Ambulatory Visit (HOSPITAL_COMMUNITY): Payer: Self-pay | Admitting: Licensed Clinical Social Worker

## 2012-03-14 ENCOUNTER — Ambulatory Visit (INDEPENDENT_AMBULATORY_CARE_PROVIDER_SITE_OTHER): Admitting: Licensed Clinical Social Worker

## 2012-03-14 DIAGNOSIS — F3189 Other bipolar disorder: Secondary | ICD-10-CM

## 2012-03-14 DIAGNOSIS — F411 Generalized anxiety disorder: Secondary | ICD-10-CM

## 2012-03-14 DIAGNOSIS — F3181 Bipolar II disorder: Secondary | ICD-10-CM

## 2012-03-14 NOTE — Progress Notes (Signed)
   THERAPIST PROGRESS NOTE  Session Time: 9:30am-10:20am  Participation Level: Active  Behavioral Response: Well GroomedAlertAnxious  Type of Therapy: Individual Therapy  Treatment Goals addressed: Anxiety and Coping  Interventions: CBT, Supportive and Reframing  Summary: Gina Barron is a 31 y.o. female who presents with anxious mood and affect. She is tired and continues to struggle with inconsistent and disrupted sleep. She is pleased to report that she followed through on recommended goals in order to decrease her anxiety while her husband drives, by taking Klonopin prior to the drive, using effective distraction tools and communicating her needs to her husband, who responded positively to her. She is upset with her friend who she has allowed to live with her and her lack of reasonability for her child. Patients generalized anxiety remains high and she reports rapid cycling. Her appetite is wnl.   Suicidal/Homicidal: Nowithout intent/plan  Therapist Response: Assessed patients current functioning and reviewed her progress. Patient is beginning to slowly understand and use thought restructuring. Her capacity to identify her distorted thoughts is improving, although her ability to identify a positive reframe is limited and she looks for guidance. Used CBT to assist patient with the identification of her negative distortions and irrational thoughts. Encouraged patient to verbalize alternative and factual responses which challenge her distortions. Used DBT to help patient build distress tolerance, mindfulness and use of distractions. Reviewed patients self care plan. Assessed her progress related to self care. Patient's self care is improving. Recommend proper diet, regular exercise, socialization and recreation.   Plan: Return again in two weeks.  Diagnosis: Axis I: Generalized Anxiety Disorder and bi poloar     Axis II: No diagnosis    Klair Leising, LCSW 03/14/2012

## 2012-03-21 ENCOUNTER — Encounter (HOSPITAL_COMMUNITY): Payer: Self-pay | Admitting: Licensed Clinical Social Worker

## 2012-03-21 ENCOUNTER — Ambulatory Visit (HOSPITAL_COMMUNITY): Payer: Self-pay | Admitting: Licensed Clinical Social Worker

## 2012-03-21 NOTE — Progress Notes (Signed)
Patient ID: Gina Barron, female   DOB: 01/10/1981, 31 y.o.   MRN: [id number] Patient was a no show/no call. Called patient and left message, asking her to contact this practice.

## 2012-03-28 ENCOUNTER — Encounter (HOSPITAL_COMMUNITY): Payer: Self-pay | Admitting: Licensed Clinical Social Worker

## 2012-03-28 ENCOUNTER — Ambulatory Visit (INDEPENDENT_AMBULATORY_CARE_PROVIDER_SITE_OTHER): Admitting: Licensed Clinical Social Worker

## 2012-03-28 DIAGNOSIS — F3189 Other bipolar disorder: Secondary | ICD-10-CM

## 2012-03-28 DIAGNOSIS — F3181 Bipolar II disorder: Secondary | ICD-10-CM

## 2012-03-28 NOTE — Progress Notes (Signed)
   THERAPIST PROGRESS NOTE  Session Time: 2:00pm-2:50pm  Participation Level: Active  Behavioral Response: Well GroomedAlertAnxious  Type of Therapy: Individual Therapy  Treatment Goals addressed: Coping  Interventions: CBT, Motivational Interviewing, Solution Focused, Supportive and Reframing  Summary: Gina Barron is a 31 y.o. female who presents with anxious mood and affect. She reports increased stress at home related to her friend and her friends daughter who moved in with her. She processes her anger with her friends irresponsible behavior and the difficulty her eight year daughter causes for patients kids. She endorses feeling overwhelmed and unable to think clearly. She feels as though she is constantly breaking up fights with children and does not get enough support from her husband as she would like. She is uncertain how to handle the situation with her friend, but feels that she needs to tell her to leave. Husband attended part of the session and expressed his concern that patient's anxiety has increased. He expresses his concern about regaining power and authority in the home and ways in which they can do this. Sleep and appetite wnl.   Suicidal/Homicidal: Nowithout intent/plan  Therapist Response: Assessed patients current functioning and reviewed progress. Reviewed coping strategies. Assessed patients safety and assisted in identifying protective factors.  Reviewed crisis plan with patient. Assisted patient with the expression of her feelings of anxiety, frustration and panic. Assisted patient and husband with problem solving. Encouraged a time frame. Explored the negative consequences associated with patients friend living with them. Used CBT to assist patient with the identification of negative distortions and irrational thoughts. Encouraged patient to verbalize alternative and factual responses which challenge thought distortions. Used DBT to practice mindfulness, review  distraction list and improve distress tolerance skills. Reviewed patients self care plan. Assessed  progress related to self care. Patient's self care is fair. Recommend proper diet, regular exercise, socialization and recreation.   Plan: Return again in one to two weeks.  Diagnosis: Axis I: bi polar 2    Axis II: No diagnosis    Semone Orlov, LCSW 03/28/2012

## 2012-04-01 ENCOUNTER — Ambulatory Visit (HOSPITAL_COMMUNITY): Payer: Self-pay | Admitting: Psychiatry

## 2012-04-02 ENCOUNTER — Ambulatory Visit (HOSPITAL_COMMUNITY): Payer: Self-pay | Admitting: Licensed Clinical Social Worker

## 2012-04-05 ENCOUNTER — Other Ambulatory Visit (HOSPITAL_COMMUNITY): Payer: Self-pay | Admitting: Psychiatry

## 2012-04-05 DIAGNOSIS — F3189 Other bipolar disorder: Secondary | ICD-10-CM

## 2012-04-17 ENCOUNTER — Encounter (HOSPITAL_COMMUNITY): Payer: Self-pay | Admitting: Licensed Clinical Social Worker

## 2012-04-17 ENCOUNTER — Ambulatory Visit (INDEPENDENT_AMBULATORY_CARE_PROVIDER_SITE_OTHER): Admitting: Licensed Clinical Social Worker

## 2012-04-17 DIAGNOSIS — F3189 Other bipolar disorder: Secondary | ICD-10-CM

## 2012-04-17 DIAGNOSIS — F3181 Bipolar II disorder: Secondary | ICD-10-CM

## 2012-04-17 NOTE — Progress Notes (Signed)
   THERAPIST PROGRESS NOTE  Session Time: 11:30am-12:20pm  Participation Level: Active  Behavioral Response: Well GroomedAlertAnxious  Type of Therapy: Individual Therapy  Treatment Goals addressed: Anxiety and Coping  Interventions: CBT, DBT, Supportive and Reframing  Summary: Gina Barron is a 31 y.o. female who presents with anxious mood and affect. She reports continued anxiety related to stress in her home. Her friend and her child remain living with patients and patient endorses conflict and frustration that her friend does not parent or take adequate responsibility to help out financially. Patient feels stuck and struggles to manage her own anxiety. She continues to hide in her bathroom as her primary strategy for coping when her house is chaotic. She gets minimal support from.her husband and expresses her frustration over this. She decreased her Depokate dose because the increase caused depression. Recommend patient discuss this with Dr. Lolly Mustache at her next visit. Her sleep is disrupted and her appetite is fair, but she her food choices are poor.    Suicidal/Homicidal: Nowithout intent/plan  Therapist Response: Assessed patients current functioning and reviewed progress. Reviewed coping strategies. Assessed patients safety and assisted in identifying protective factors.  Reviewed crisis plan with patient. Assisted patient with the expression of her feelings of anxiety, anger and frustration with her current living situation. Used CBT to assist patient with the identification of negative distortions and irrational thoughts. Encouraged patient to verbalize alternative and factual responses which challenge thought distortions. Patients distress tolerance is poor. Used DBT to practice mindfulness, review distraction list and improve distress tolerance skills. She remains resistant to make changes which will temporarily increase her anxiety. Used motivational interviewing to assist and  encourage patient through the change process. Explored patients barriers to change. Reviewed patients self care plan. Assessed  progress related to self care. Patient's self care is fair. Recommend proper diet, regular exercise, socialization and recreation.   Plan: Return again in two weeks.  Diagnosis: Axis I: bi polar 2    Axis II: No diagnosis    Gina Santagata, LCSW 04/17/2012

## 2012-04-24 ENCOUNTER — Encounter (HOSPITAL_COMMUNITY): Payer: Self-pay | Admitting: Psychiatry

## 2012-04-24 ENCOUNTER — Ambulatory Visit (INDEPENDENT_AMBULATORY_CARE_PROVIDER_SITE_OTHER): Admitting: Psychiatry

## 2012-04-24 VITALS — BP 109/72 | HR 77 | Wt 142.2 lb

## 2012-04-24 DIAGNOSIS — F3189 Other bipolar disorder: Secondary | ICD-10-CM

## 2012-04-24 DIAGNOSIS — F319 Bipolar disorder, unspecified: Secondary | ICD-10-CM

## 2012-04-24 DIAGNOSIS — F329 Major depressive disorder, single episode, unspecified: Secondary | ICD-10-CM

## 2012-04-24 MED ORDER — ALPRAZOLAM 0.25 MG PO TABS: 0.2500 mg | ORAL_TABLET | ORAL | Status: DC | PRN
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
## 2012-05-13 ENCOUNTER — Telehealth (HOSPITAL_COMMUNITY): Payer: Self-pay | Admitting: *Deleted

## 2012-05-13 NOTE — Telephone Encounter (Signed)
See phone note

## 2012-05-14 ENCOUNTER — Telehealth (HOSPITAL_COMMUNITY): Payer: Self-pay | Admitting: *Deleted

## 2012-05-14 ENCOUNTER — Other Ambulatory Visit (HOSPITAL_COMMUNITY): Payer: Self-pay | Admitting: Psychiatry

## 2012-05-14 NOTE — Telephone Encounter (Signed)
Per Dr.Arfeen:  No Klonopin. She did not feel improvement with Klonopin. Do not authorize klonopin and document in chart.

## 2012-05-14 NOTE — Telephone Encounter (Signed)
Patient requested Klonopin however we will not authorize Klonopin since patient is taking Xanax.  She had mentioned that Klonopin does not work.  We will address this issue on her next appointment.

## 2012-05-19 ENCOUNTER — Other Ambulatory Visit (HOSPITAL_COMMUNITY): Payer: Self-pay | Admitting: Psychiatry

## 2012-05-19 DIAGNOSIS — F319 Bipolar disorder, unspecified: Secondary | ICD-10-CM

## 2012-05-22 ENCOUNTER — Ambulatory Visit (HOSPITAL_COMMUNITY): Payer: Self-pay | Admitting: Psychiatry

## 2012-05-23 ENCOUNTER — Other Ambulatory Visit (HOSPITAL_COMMUNITY): Payer: Self-pay | Admitting: Psychiatry

## 2012-05-26 ENCOUNTER — Other Ambulatory Visit (HOSPITAL_COMMUNITY): Payer: Self-pay | Admitting: Psychiatry

## 2012-05-27 ENCOUNTER — Encounter (HOSPITAL_COMMUNITY): Payer: Self-pay | Admitting: Psychiatry

## 2012-05-27 ENCOUNTER — Ambulatory Visit (INDEPENDENT_AMBULATORY_CARE_PROVIDER_SITE_OTHER): Admitting: Psychiatry

## 2012-05-27 DIAGNOSIS — Z79899 Other long term (current) drug therapy: Secondary | ICD-10-CM

## 2012-05-27 DIAGNOSIS — F3189 Other bipolar disorder: Secondary | ICD-10-CM

## 2012-05-27 DIAGNOSIS — F319 Bipolar disorder, unspecified: Secondary | ICD-10-CM

## 2012-05-27 DIAGNOSIS — F329 Major depressive disorder, single episode, unspecified: Secondary | ICD-10-CM

## 2012-05-27 MED ORDER — ALPRAZOLAM 0.25 MG PO TABS: 0.2500 mg | ORAL_TABLET | Freq: Every evening | ORAL | Status: DC | PRN

## 2012-05-27 MED ORDER — DIVALPROEX SODIUM ER 250 MG PO TB24: 250.0000 mg | ORAL_TABLET | Freq: Two times a day (BID) | ORAL | Status: DC
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
## 2012-05-28 ENCOUNTER — Telehealth (HOSPITAL_COMMUNITY): Payer: Self-pay

## 2012-05-28 NOTE — Telephone Encounter (Signed)
Patient is taking Depakote ER.  Earlier she was taking Depakote DR.  I explain not to worry about it.  Continue ER.

## 2012-05-31 ENCOUNTER — Ambulatory Visit (INDEPENDENT_AMBULATORY_CARE_PROVIDER_SITE_OTHER): Admitting: Licensed Clinical Social Worker

## 2012-05-31 ENCOUNTER — Encounter (HOSPITAL_COMMUNITY): Payer: Self-pay | Admitting: Licensed Clinical Social Worker

## 2012-05-31 DIAGNOSIS — F3189 Other bipolar disorder: Secondary | ICD-10-CM

## 2012-05-31 DIAGNOSIS — F3181 Bipolar II disorder: Secondary | ICD-10-CM

## 2012-05-31 NOTE — Progress Notes (Signed)
   THERAPIST PROGRESS NOTE  Session Time: 11:30am-12:20pm  Participation Level: Active  Behavioral Response: Well GroomedAlertAnxious, Depressed and Irritable  Type of Therapy: Individual Therapy  Treatment Goals addressed: Coping  Interventions: CBT, Motivational Interviewing, Solution Focused, Strength-based, Supportive and Reframing  Summary: Gina Barron is a 31 y.o. female who presents with anxious mood and irritable affect. She reports an absence from therapy because she could not get an appointment. She endorses increased stress, depression, irritability, anxiety and panic attacks. She and her family have been told to move and are in the process of buying a home. Her husband has been indicted on three felony charges and she endorses fear and anxiety related to this. She is trying to pack up a home, manage her children and her husband is not helping as he has "checked out" since he did not expect to be indicted. Patient is overwhelmed and her only strategy to manage her stress is playing games on the computer while locked in her bathroom. She remains isolated and does not leave the home unless she has to. Her sleep is poor and her appetite is wnl.   Suicidal/Homicidal: Nowithout intent/plan  Therapist Response: Assessed patients current functioning and reviewed progress. Reviewed coping strategies. Assessed patients safety and assisted in identifying protective factors.  Reviewed crisis plan with patient. Assisted patient with the expression of her feelings of frustration and irritabilty.  Used DBT to practice mindfulness, review distraction list and improve distress tolerance skills. Used DBT to practice mindfulness, review distraction list and improve distress tolerance skills. Used motivational interviewing to assist and encourage patient through the change process. Explored patients barriers to change. Reviewed patients self care plan. Assessed  progress related to self care.  Patient's self care is fair. Recommend proper diet, regular exercise, socialization and recreation.   Plan: Return again in one weeks.  Diagnosis: Axis I: bi polar disorder    Axis II: No diagnosis    [name], LCSW 05/31/2012

## 2012-06-06 ENCOUNTER — Ambulatory Visit (INDEPENDENT_AMBULATORY_CARE_PROVIDER_SITE_OTHER): Admitting: Licensed Clinical Social Worker

## 2012-06-06 DIAGNOSIS — F411 Generalized anxiety disorder: Secondary | ICD-10-CM

## 2012-06-06 DIAGNOSIS — F3189 Other bipolar disorder: Secondary | ICD-10-CM

## 2012-06-06 DIAGNOSIS — F3181 Bipolar II disorder: Secondary | ICD-10-CM
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
## 2012-06-13 ENCOUNTER — Ambulatory Visit (INDEPENDENT_AMBULATORY_CARE_PROVIDER_SITE_OTHER): Admitting: Licensed Clinical Social Worker

## 2012-06-13 ENCOUNTER — Encounter (HOSPITAL_COMMUNITY): Payer: Self-pay | Admitting: Licensed Clinical Social Worker

## 2012-06-13 DIAGNOSIS — F3189 Other bipolar disorder: Secondary | ICD-10-CM

## 2012-06-13 DIAGNOSIS — F3181 Bipolar II disorder: Secondary | ICD-10-CM

## 2012-06-13 NOTE — Progress Notes (Signed)
   THERAPIST PROGRESS NOTE  Session Time: 8:30am-9:20am  Participation Level: Active  Behavioral Response: Well GroomedAlertAnxious and Irritable  Type of Therapy: Individual Therapy  Treatment Goals addressed: Anger and Coping  Interventions: CBT, DBT, Motivational Interviewing, Strength-based, Supportive and Reframing  Summary: Gina Barron is a 31 y.o. female who presents with anxious mood and irritable affect. She is angry and anxious after meeting with her husband and his attorney to discuss the case. She learned that her husband could face life in prison at the very worst and is concerned that he is being encouraged to accept a plea bargain. She processes her fear that her husband wants to fight the charges and that he may end up going to prison for something which she does not believe he has done. She is convinced that her daughter has lied and that he accusations are not true. She is not sleeping well, has racing thoughts and is highly anxious about the future. Her appetite is wnl.    Suicidal/Homicidal: Nowithout intent/plan  Therapist Response: Assessed patients current functioning and reviewed progress. Reviewed coping strategies. Assessed patients safety and assisted in identifying protective factors.  Reviewed crisis plan with patient. Assisted patient with the expression of her feelings of anger. Used CBT to assist patient with the identification of negative distortions and irrational thoughts. Encouraged patient to verbalize alternative and factual responses which challenge thought distortions. Used DBT to practice mindfulness, review distraction list and improve distress tolerance skills. Used motivational interviewing to assist and encourage patient through the change process. Explored patients barriers to change. Reviewed patients self care plan. Assessed  progress related to self care. Patient's self care is fair. Recommend proper diet, regular exercise, socialization and  recreation.   Plan: Return again in one weeks.  Diagnosis: Axis I: bi polar 2    Axis II: No diagnosis    Taden Witter, LCSW 06/13/2012

## 2012-06-21 ENCOUNTER — Ambulatory Visit (INDEPENDENT_AMBULATORY_CARE_PROVIDER_SITE_OTHER): Admitting: Licensed Clinical Social Worker

## 2012-06-21 DIAGNOSIS — F3181 Bipolar II disorder: Secondary | ICD-10-CM

## 2012-06-21 DIAGNOSIS — F3189 Other bipolar disorder: Secondary | ICD-10-CM

## 2012-06-21 NOTE — Progress Notes (Signed)
   THERAPIST PROGRESS NOTE  Session Time: 8:30am-9:20am  Participation Level: Active  Behavioral Response: Well GroomedAlertAnxious  Type of Therapy: Individual Therapy  Treatment Goals addressed: Anxiety and Coping  Interventions: CBT, DBT, Motivational Interviewing, Strength-based, Supportive and Reframing  Summary: Gina Barron is a 31 y.o. female who presents with anxious mood and affect. She reports increased stress related to moving in two weeks, feels unprepared and is not receiving the help she needs from her family. She processes her attempts to rid their home of hoarded possessions her husband and youngest daughter have accumulated over time and how frustrating this is for her. Processed her fear that her husband will be incarcerated and her feelings of helplessness. She remains angry at her daughter for unjustly accusing him. As she prepares to move into a new home, she is fearful that she will be unable to care for the home in the event that her husband is sent to prison. She wants to have plans in place to deal with this anxiety, but is unable to find solutions given the circumstances. She is unhappy that she is experiencing hair loss due to Depakote side effects and plans to discuss this with Dr. Lolly Mustache at her next visit. Her sleep is disrupted and inconsistent and her appetite is wnl.    Suicidal/Homicidal: Nowithout intent/plan  Therapist Response: Assessed patients current functioning and reviewed progress. Reviewed coping strategies. Assessed patients safety and assisted in identifying protective factors.  Reviewed crisis plan with patient. Assisted patient with the expression of her feelings of anxiety. Reviewed self care, healthy boundaries and asking for help. Used CBT to assist patient with the identification of negative distortions and irrational thoughts. Encouraged patient to verbalize alternative and factual responses which challenge thought distortions. Used DBT  to practice mindfulness, review distraction list and improve distress tolerance skills. Used motivational interviewing to assist and encourage patient through the change process. Explored patients barriers to change. Reviewed patients self care plan. Assessed  progress related to self care. Patient's self care is fair. Recommend proper diet, regular exercise, socialization and recreation.   Plan: Return again in one weeks.  Diagnosis: Axis I: bi ploar 2    Axis II: No diagnosis    Royetta Probus, LCSW 06/21/2012

## 2012-07-03 LAB — COMPREHENSIVE METABOLIC PANEL
ALT: 11 U/L (ref 0–35)
AST: 13 U/L (ref 0–37)
BUN: 7 mg/dL (ref 6–23)
Calcium: 9.5 mg/dL (ref 8.4–10.5)
Chloride: 105 mEq/L (ref 96–112)
Creat: 0.8 mg/dL (ref 0.50–1.10)
Total Bilirubin: 0.4 mg/dL (ref 0.3–1.2)

## 2012-07-03 LAB — CBC WITH DIFFERENTIAL/PLATELET
Basophils Absolute: 0.1 10*3/uL (ref 0.0–0.1)
Basophils Relative: 1 % (ref 0–1)
Eosinophils Absolute: 0.2 10*3/uL (ref 0.0–0.7)
Eosinophils Relative: 2 % (ref 0–5)
HCT: 40.1 % (ref 36.0–46.0)
Lymphocytes Relative: 40 % (ref 12–46)
MCH: 30.6 pg (ref 26.0–34.0)
MCHC: 34.2 g/dL (ref 30.0–36.0)
MCV: 89.7 fL (ref 78.0–100.0)
Monocytes Absolute: 0.4 10*3/uL (ref 0.1–1.0)
Platelets: 197 10*3/uL (ref 150–400)
RDW: 12.7 % (ref 11.5–15.5)
WBC: 7.7 10*3/uL (ref 4.0–10.5)

## 2012-07-08 ENCOUNTER — Ambulatory Visit (HOSPITAL_COMMUNITY): Admitting: Psychiatry

## 2012-07-29 ENCOUNTER — Ambulatory Visit (INDEPENDENT_AMBULATORY_CARE_PROVIDER_SITE_OTHER): Admitting: Psychiatry

## 2012-07-29 ENCOUNTER — Encounter (HOSPITAL_COMMUNITY): Payer: Self-pay | Admitting: Psychiatry

## 2012-07-29 VITALS — Wt 133.0 lb

## 2012-07-29 DIAGNOSIS — F319 Bipolar disorder, unspecified: Secondary | ICD-10-CM

## 2012-07-29 DIAGNOSIS — F329 Major depressive disorder, single episode, unspecified: Secondary | ICD-10-CM

## 2012-07-29 DIAGNOSIS — F3189 Other bipolar disorder: Secondary | ICD-10-CM

## 2012-07-29 MED ORDER — DIVALPROEX SODIUM 250 MG PO DR TAB: 250.0000 mg | DELAYED_RELEASE_TABLET | Freq: Two times a day (BID) | ORAL | Status: DC

## 2012-07-29 MED ORDER — ALPRAZOLAM 0.25 MG PO TABS: 0.2500 mg | ORAL_TABLET | Freq: Every evening | ORAL | Status: DC | PRN
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
## 2012-08-08 ENCOUNTER — Encounter (HOSPITAL_COMMUNITY): Payer: Self-pay | Admitting: Licensed Clinical Social Worker

## 2012-08-08 ENCOUNTER — Ambulatory Visit (INDEPENDENT_AMBULATORY_CARE_PROVIDER_SITE_OTHER): Admitting: Licensed Clinical Social Worker

## 2012-08-08 DIAGNOSIS — F329 Major depressive disorder, single episode, unspecified: Secondary | ICD-10-CM

## 2012-08-08 DIAGNOSIS — F3181 Bipolar II disorder: Secondary | ICD-10-CM
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
## 2012-08-15 ENCOUNTER — Ambulatory Visit (INDEPENDENT_AMBULATORY_CARE_PROVIDER_SITE_OTHER): Admitting: Licensed Clinical Social Worker

## 2012-08-15 DIAGNOSIS — F3181 Bipolar II disorder: Secondary | ICD-10-CM

## 2012-08-15 DIAGNOSIS — F3189 Other bipolar disorder: Secondary | ICD-10-CM
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
## 2012-08-20 ENCOUNTER — Ambulatory Visit (INDEPENDENT_AMBULATORY_CARE_PROVIDER_SITE_OTHER): Admitting: Licensed Clinical Social Worker

## 2012-08-20 DIAGNOSIS — F3181 Bipolar II disorder: Secondary | ICD-10-CM

## 2012-08-20 DIAGNOSIS — F3189 Other bipolar disorder: Secondary | ICD-10-CM

## 2012-08-20 NOTE — Progress Notes (Signed)
   THERAPIST PROGRESS NOTE  Session Time: 3:00pm-3:50pm  Participation Level: Active  Behavioral Response: Fairly GroomedAlertAnxious  Type of Therapy: Individual Therapy  Treatment Goals addressed: Anxiety and Coping  Interventions: CBT, Motivational Interviewing, Strength-based, Supportive and Reframing  Summary: Gina Barron is a 31 y.o. female who presents with anxious mood and affect. She reports no change in her degree of frustration and anxiety over the situation with her family. She remains very angry with her daughter and feels hopeless about the situation. She does not see any way she can release or express her anger towards her daughter in the way in which she would like. She is angry that she is being restricted in her communication with her daughter. She is looking for a job because she needs the money. She endorses anxiety related to finances. Her sleep and appetite are wnl. She remains isolated during the day and is not socializing outside the home.    Suicidal/Homicidal: Nowithout intent/plan  Therapist Response: Assessed patients current functioning and reviewed progress. Reviewed coping strategies. Assessed patients safety and assisted in identifying protective factors.  Reviewed crisis plan with patient. Assisted patient with the expression of her feelings of frustration and anger. Used CBT to assist patient with the identification of negative distortions and irrational thoughts. Encouraged patient to verbalize alternative and factual responses which challenge thought distortions. Used motivational interviewing to assist and encourage patient through the change process. Explored patients barriers to change. Reviewed patients self care plan. Assessed  progress related to self care. Patient's self care is fair. Recommend proper diet, regular exercise, socialization and recreation.   Plan: Return again in one weeks.  Diagnosis: Axis I: bi polar 2    Axis II: No  diagnosis    Rebeccah Ivins, LCSW 08/20/2012

## 2012-08-27 ENCOUNTER — Ambulatory Visit (INDEPENDENT_AMBULATORY_CARE_PROVIDER_SITE_OTHER): Admitting: Licensed Clinical Social Worker

## 2012-08-27 DIAGNOSIS — F3189 Other bipolar disorder: Secondary | ICD-10-CM

## 2012-08-27 DIAGNOSIS — F3181 Bipolar II disorder: Secondary | ICD-10-CM

## 2012-08-27 IMAGING — CR DG HAND COMPLETE 3+V*L*
3 series · 3 of 3 positions shown · non-contrast
Comparison: Wrist radiographs 10/03/2009 and MRI 12/01/2009.

CLINICAL DATA: Left hand pain.  Palpable knot dorsum of the hand.
History of arthritis.

LEFT HAND - COMPLETE 3+ VIEW

[view not recorded (1 of 3)]
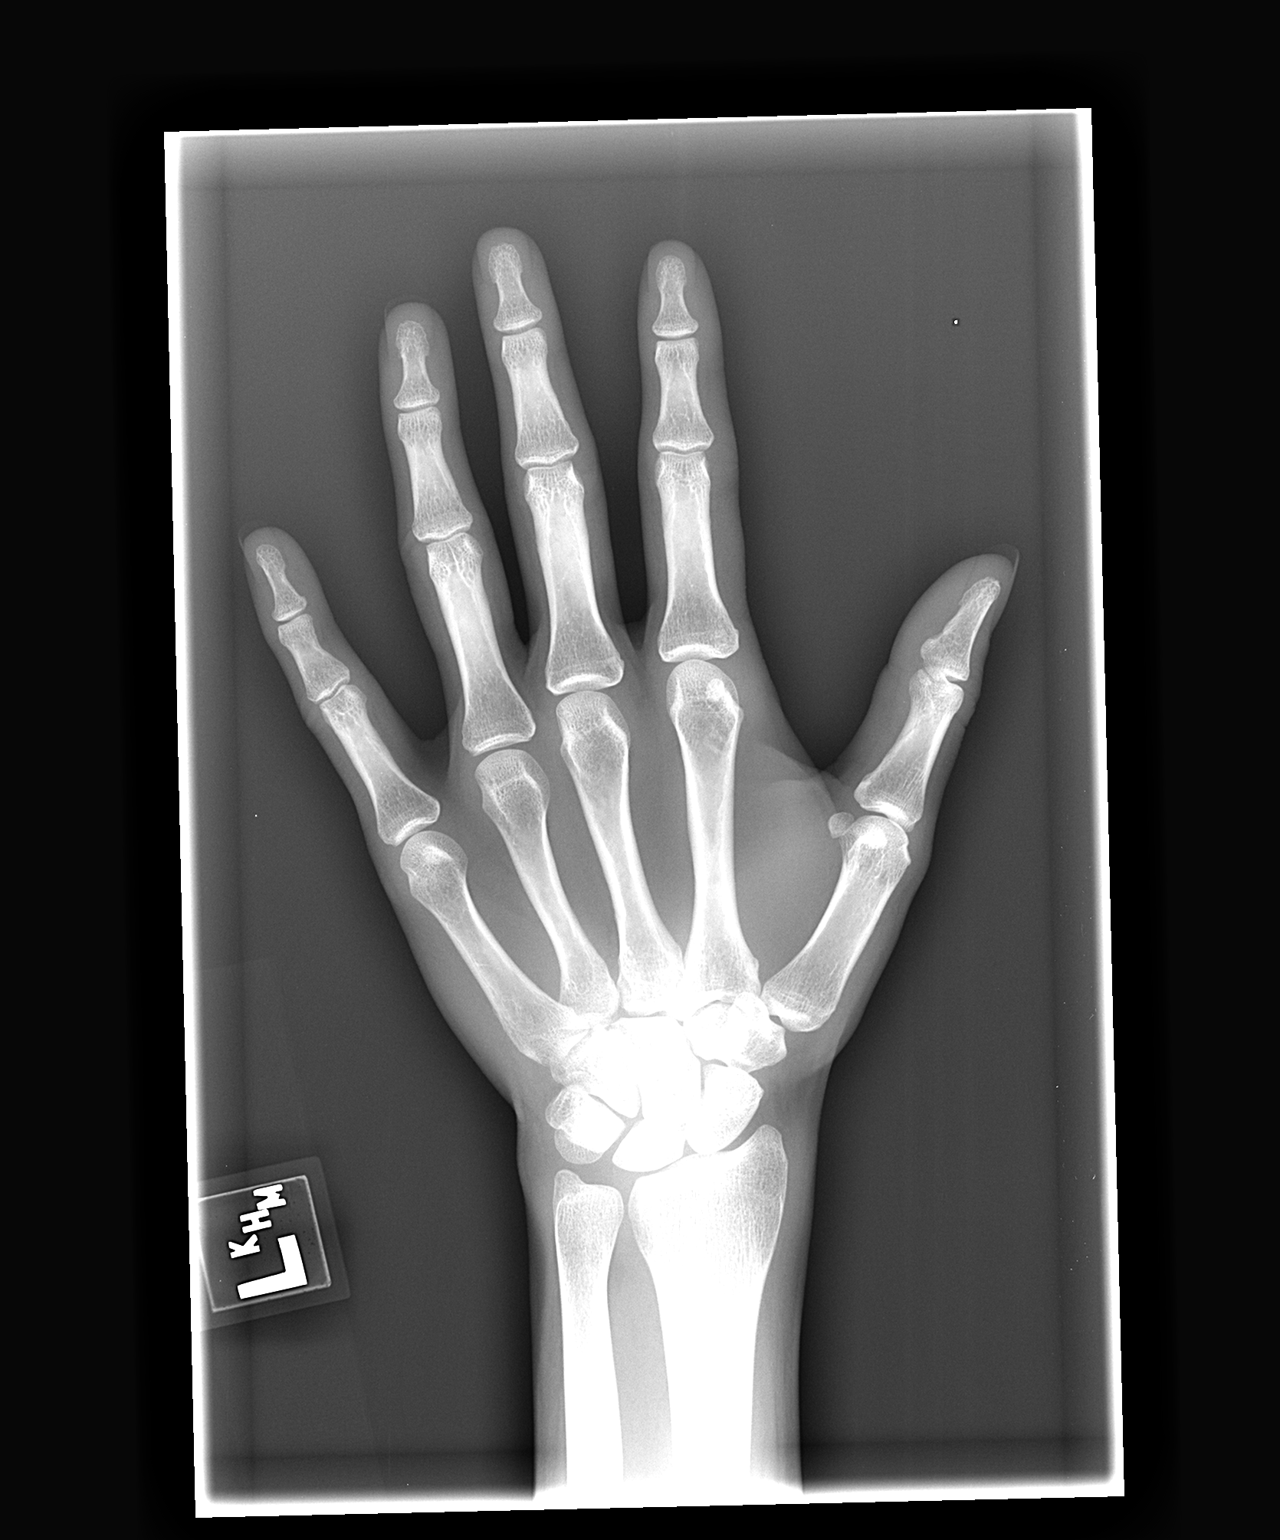

[view not recorded (2 of 3)]
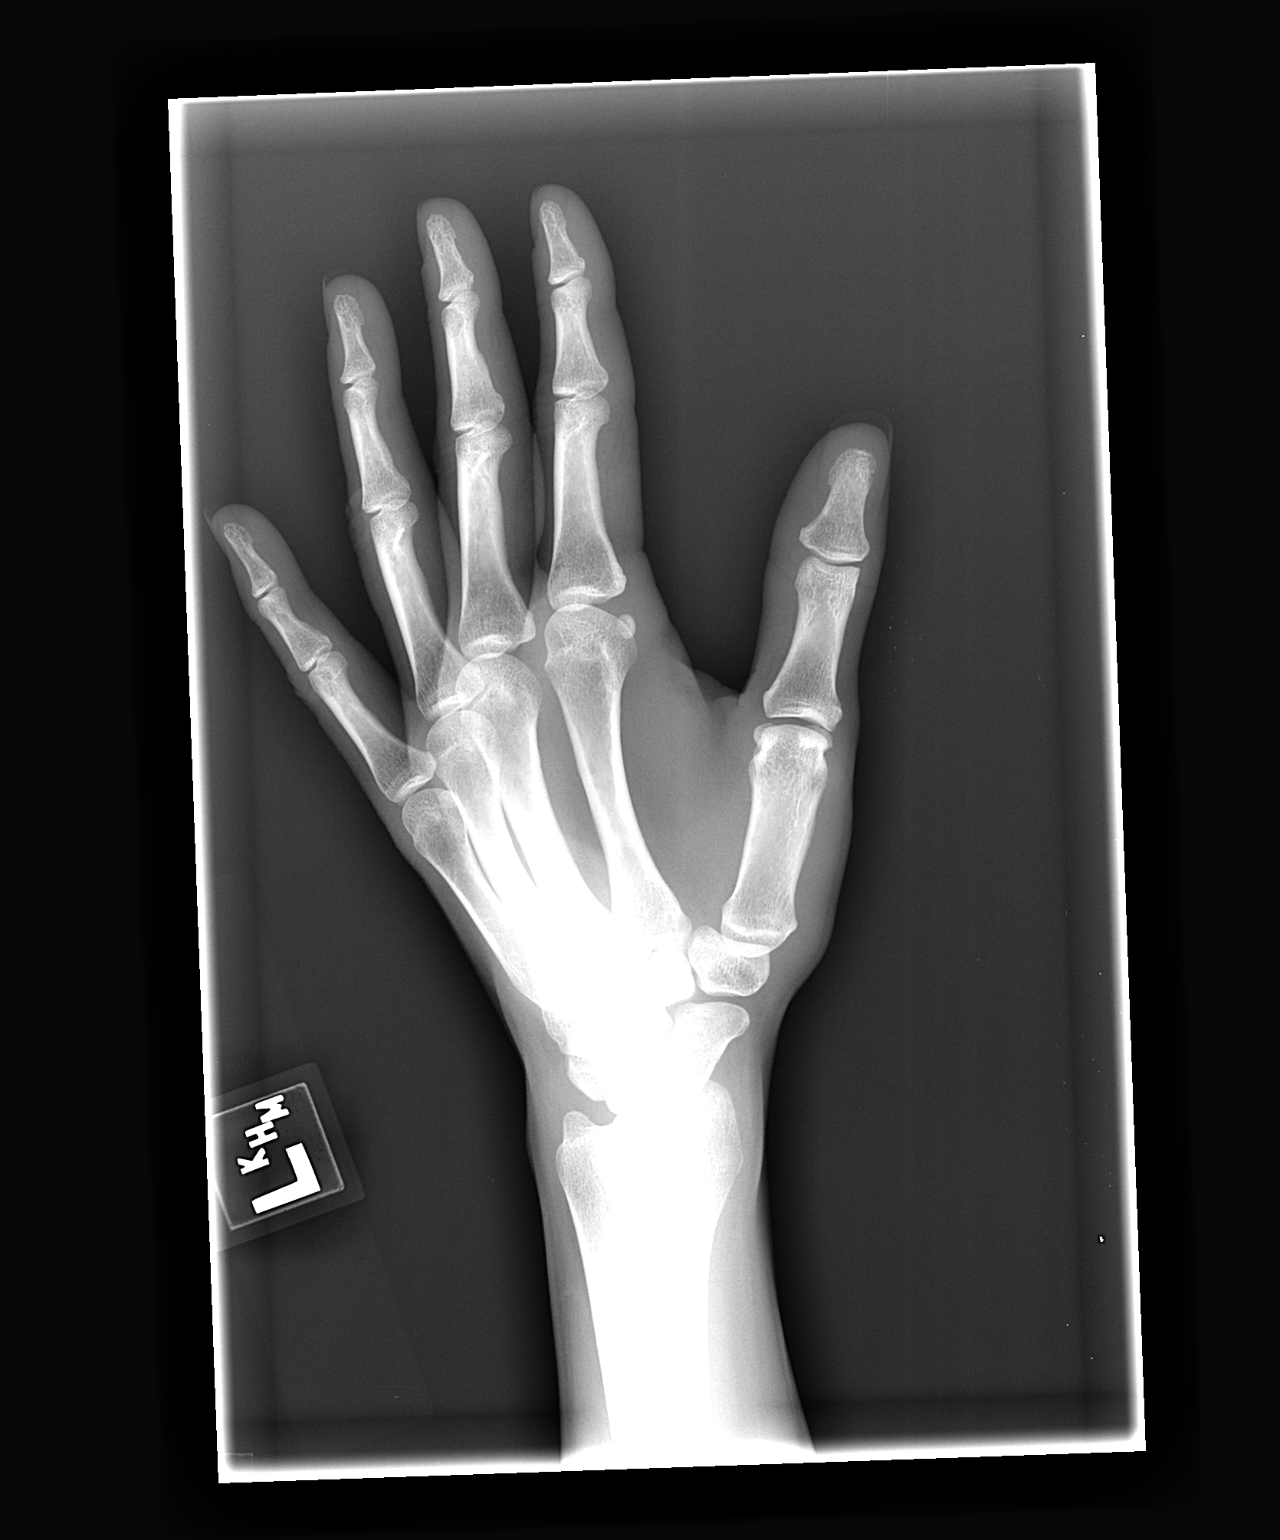

[view not recorded (3 of 3)]
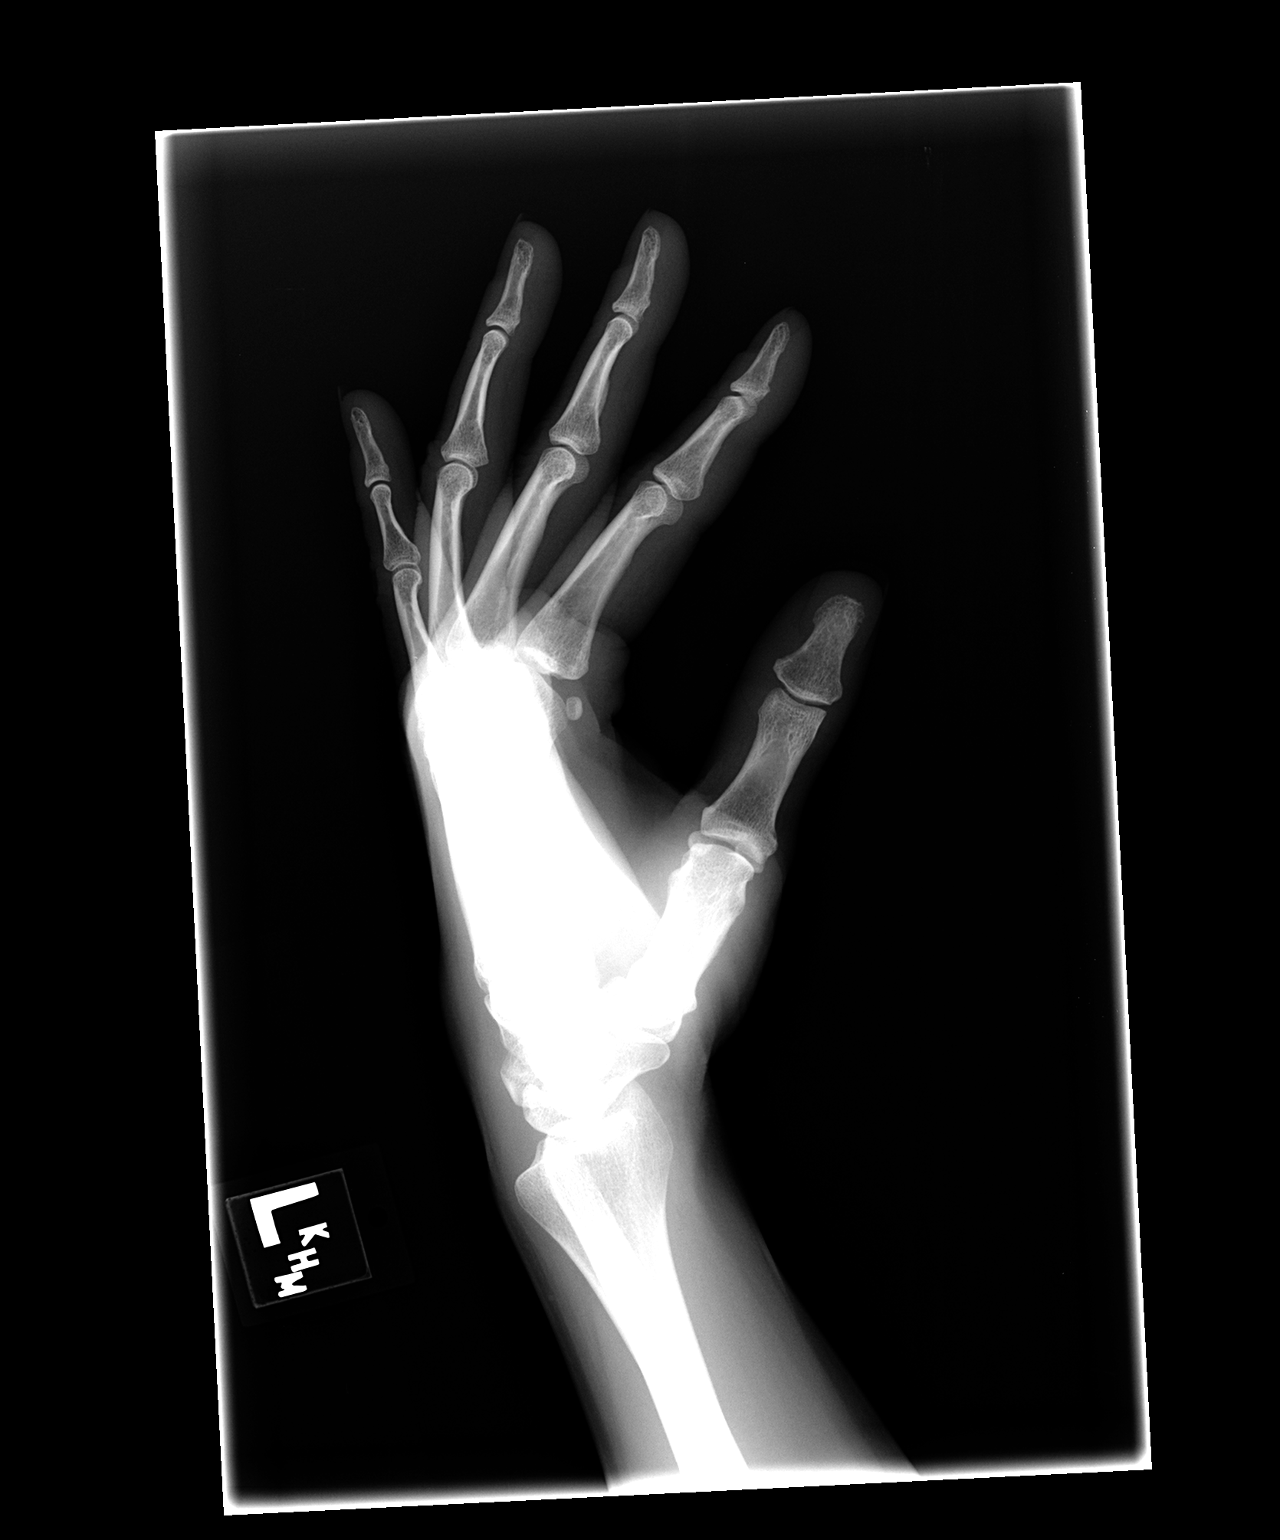

[3 of 3 positions shown; findings below may reference images not displayed]

FINDINGS: The mineralization and alignment are normal.  There is no
evidence of acute fracture or dislocation.  No focal soft tissue
swelling is evident.  There is no apparent erosive change.
IMPRESSION: No acute osseous findings or focal soft tissue abnormality
demonstrated.
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
## 2012-09-03 ENCOUNTER — Ambulatory Visit (HOSPITAL_COMMUNITY): Payer: Self-pay | Admitting: Licensed Clinical Social Worker

## 2012-09-09 ENCOUNTER — Encounter (HOSPITAL_COMMUNITY): Payer: Self-pay | Admitting: Psychiatry

## 2012-09-09 ENCOUNTER — Ambulatory Visit (INDEPENDENT_AMBULATORY_CARE_PROVIDER_SITE_OTHER): Admitting: Psychiatry

## 2012-09-09 VITALS — BP 117/81 | HR 85 | Wt 134.6 lb

## 2012-09-09 DIAGNOSIS — F319 Bipolar disorder, unspecified: Secondary | ICD-10-CM

## 2012-09-09 DIAGNOSIS — F3189 Other bipolar disorder: Secondary | ICD-10-CM

## 2012-09-09 DIAGNOSIS — F329 Major depressive disorder, single episode, unspecified: Secondary | ICD-10-CM

## 2012-09-09 MED ORDER — DIVALPROEX SODIUM 250 MG PO DR TAB: 250.0000 mg | DELAYED_RELEASE_TABLET | Freq: Two times a day (BID) | ORAL | Status: DC

## 2012-09-09 MED ORDER — ALPRAZOLAM 0.25 MG PO TABS: 0.2500 mg | ORAL_TABLET | Freq: Every evening | ORAL | Status: DC | PRN
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
## 2012-09-12 ENCOUNTER — Ambulatory Visit (INDEPENDENT_AMBULATORY_CARE_PROVIDER_SITE_OTHER): Admitting: Licensed Clinical Social Worker

## 2012-09-12 DIAGNOSIS — F3181 Bipolar II disorder: Secondary | ICD-10-CM

## 2012-09-12 DIAGNOSIS — F3189 Other bipolar disorder: Secondary | ICD-10-CM
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
## 2012-09-27 ENCOUNTER — Ambulatory Visit (INDEPENDENT_AMBULATORY_CARE_PROVIDER_SITE_OTHER): Admitting: Licensed Clinical Social Worker

## 2012-09-27 DIAGNOSIS — F3189 Other bipolar disorder: Secondary | ICD-10-CM

## 2012-09-27 DIAGNOSIS — F3181 Bipolar II disorder: Secondary | ICD-10-CM

## 2012-09-27 NOTE — Progress Notes (Signed)
   THERAPIST PROGRESS NOTE  Session Time: 9:30am-10:20am  Participation Level: Active  Behavioral Response: Fairly GroomedAlertAnxious  Type of Therapy: Individual Therapy  Treatment Goals addressed: Coping  Interventions: CBT, Solution Focused, Strength-based, Supportive and Reframing  Summary: Gina Barron is a 32 y.o. female who presents with anxious mood and bright affect. 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 Last week, she worked 53 hours and this week will work 43 hours. She only agreed to work 20 hours. She feels taken advantage of and struggles to say no when asked to work more. She works late, comes home and is too "wired" to fall asleep. She is only getting about four hours of sleep as a result. She talks about her anxiety and frustration related to her husbands trial. She is feeling a bit more hopeful about a positive outcome. Her appetite is wnl.   Suicidal/Homicidal: Nowithout intent/plan  Therapist Response: Assessed patients current functioning and reviewed progress. Reviewed coping strategies. Assessed patients safety and assisted in identifying protective factors.  Reviewed crisis plan with patient. Assisted patient with the expression of her feelings of anxiety. Discussed healthy boundaries and assertive communication. Used CBT to assist patient with the identification of negative distortions and irrational thoughts. Encouraged patient to verbalize alternative and factual responses which challenge thought distortions. Discussed priority setting. Used motivational interviewing to assist and encourage patient through the change process. Explored patients barriers to change. Used DBT to practice mindfulness, review distraction list and improve distress tolerance skills. Reviewed patients self care plan. Assessed  progress related to self care. Patient's self care  is fair. Recommend proper diet, regular exercise, socialization and recreation.   Plan: Return again in one weeks.  Diagnosis: Axis I: bi polar 2    Axis II: No diagnosis    Deshay Blumenfeld, LCSW 09/27/2012

## 2012-10-03 ENCOUNTER — Ambulatory Visit (HOSPITAL_COMMUNITY): Payer: Self-pay | Admitting: Licensed Clinical Social Worker

## 2012-10-10 ENCOUNTER — Ambulatory Visit (INDEPENDENT_AMBULATORY_CARE_PROVIDER_SITE_OTHER): Payer: 59 | Admitting: Licensed Clinical Social Worker

## 2012-10-10 NOTE — Progress Notes (Signed)
   THERAPIST PROGRESS NOTE  Session Time: 9:30am-10:20am  Participation Level: Active  Behavioral Response: Fairly GroomedAlertAnxious  Type of Therapy: Individual Therapy  Treatment Goals addressed: Anxiety and Coping  Interventions: CBT, DBT, Strength-based, Supportive and Reframing  Summary: Gina Barron is a 32 y.o. female who presents with anxious mood and affect. She is tired and overwhelmed. She continues to work long hours and has tried to put some boundaries in place at her job, but finds this difficult. She is working less than she has, but still over 40 hours per week. 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 When she becomes busy she must start at the beginning of her list, even if it is impractical.  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 She expresses anger with her husband for his lack of help around the home and with the children. She feels primarily responsible for keeping the family functioning. Her sleep is disrupted and her appetite is wnl.   Suicidal/Homicidal: Nowithout intent/plan  Therapist Response: Assessed patients current functioning and reviewed progress. Reviewed coping strategies. Assessed patients safety and assisted in identifying protective factors.  Reviewed crisis plan with patient. Assisted patient with the expression of her feelings of anxiety and frustration. Used CBT to assist patient with the identification of negative distortions and irrational thoughts. Encouraged patient to verbalize alternative and factual responses which challenge thought distortions. Used DBT to practice mindfulness, review distraction list and improve distress tolerance skills. Reviewed healthy boundaries and assertive communication tools. Reviewed patients self care plan. Assessed  progress related to self care. Patient's self care  is fair. Recommend proper diet, regular exercise, socialization and recreation.   Plan: Return again in one weeks.  Diagnosis: Axis I: bi polar 2    Axis II: No diagnosis    Valen Mascaro, LCSW 10/10/2012

## 2012-10-17 ENCOUNTER — Ambulatory Visit (INDEPENDENT_AMBULATORY_CARE_PROVIDER_SITE_OTHER): Payer: 59 | Admitting: Licensed Clinical Social Worker

## 2012-10-17 NOTE — Progress Notes (Signed)
   THERAPIST PROGRESS NOTE  Session Time: 9:30am-10:20am  Participation Level: Active  Behavioral Response: Fairly GroomedAlertAnxious  Type of Therapy: Individual Therapy  Treatment Goals addressed: Anxiety and Coping  Interventions: CBT, Motivational Interviewing, Strength-based, Supportive and Reframing  Summary: Gina Barron is a 32 y.o. female who presents with anxious mood and affect. She reports improvement in her stress level at work because a new person has been hired. She continues to work long hours, but enjoys this and finds this distraction helpful to manage her racing thoughts and anxiety about problems at home and legal issues. She expresses concern about her youngest daughter, whose behavior is becoming more defiant with increased sepearation anxiety. Patient admits that she gives in to her daughter and does not challenge her behaviors because her behavior escalates. Processed patients frustration with her older daughter including feelings of guilt and shame over not wanting to spend time with her daughter. Her sleep is disrupted and her appetite has decreased with weight loss due to work.    Suicidal/Homicidal: Nowithout intent/plan  Therapist Response: Assessed patients current functioning and reviewed progress. Reviewed coping strategies. Assessed patients safety and assisted in identifying protective factors.  Reviewed crisis plan with patient. Assisted patient with the expression of frustration and anxiety. Reviewed patients self care plan. Assessed progress related to self care. Patients self care is fair. Recommend daily exercise, increased socialization and recreation. Used CBT to assist patient with the identification of negative distortions and irrational thoughts. Encouraged patient to verbalize alternative and factual responses which challenge thought distortions. Reviewed healthy boundaries and assertive communication. Used DBT to practice mindfulness, review  distraction list and improve distress tolerance skills. Used motivational interviewing to assist and encourage patient through the change process. Explored patients barriers to change.   Plan: Return again in two weeks.  Diagnosis: Axis I: bi polar 2    Axis II: No diagnosis    Leavy Heatherly, LCSW 10/17/2012

## 2012-10-31 ENCOUNTER — Ambulatory Visit (INDEPENDENT_AMBULATORY_CARE_PROVIDER_SITE_OTHER): Payer: 59 | Admitting: Licensed Clinical Social Worker

## 2012-10-31 NOTE — Progress Notes (Signed)
   THERAPIST PROGRESS NOTE  Session Time: 8:30am-9:20am  Participation Level: Active  Behavioral Response: Fairly GroomedAlertAnxious  Type of Therapy: Individual Therapy  Treatment Goals addressed: Coping  Interventions: CBT, DBT, Solution Focused, Strength-based, Supportive and Reframing  Summary: [name] is a 32 y.o. female who presents with anxious mood and affect. She is tired and continues to report poor and inconsistent sleep. Her sleep hygiene is poor as she spends up to two hours on her phone after work, Radiation protection practitioner games or surfing the internet. She continues to have problems with her youngest daughter and fights with her every morning to get up for school. She describes resorting to extreme measures such as throwing cold water on her, taking the covers off her and picking her up. She feels that her daughter is manipulating her by using separation anxiety as an excuse for why she does not want to go to school. Patient reports ongoing mood instability of her own. Despite her efforts, her anxiety remains "out of control". She is obsessive at work over cleaning and finds it difficult to move on to stay on task.    Suicidal/Homicidal: Nowithout intent/plan  Therapist Response: Assessed patients current functioning and reviewed progress. Reviewed coping strategies. Assessed patients safety and assisted in identifying protective factors.  Reviewed crisis plan with patient. Assisted patient with the expression of frustration and anxiety. Reviewed patients self care plan. Assessed progress related to self care. Patients self care is fair. Recommend daily exercise, increased socialization and recreation. Reviewed healthy boundaries and assertive communication. Used CBT to assist patient with the identification of negative distortions and irrational thoughts. Encouraged patient to verbalize alternative and factual responses which challenge thought distortions. Used DBT to practice  mindfulness, review distraction list and improve distress tolerance skills. Recommend patient ask husband to take over getting their daughter up for school and to disengage herself from this conflict. Educated patient on sleep hygeine. Recommend no more than 30 minutes of down time on the phone or computer after work.   Plan: Return again in one to two weeks.  Diagnosis: Axis I: bi polar 2    Axis II: No diagnosis    NORDEN,KRISTIN, LCSW 10/31/2012

## 2012-11-07 ENCOUNTER — Encounter (HOSPITAL_COMMUNITY): Payer: Self-pay | Admitting: Psychiatry

## 2012-11-07 ENCOUNTER — Ambulatory Visit (INDEPENDENT_AMBULATORY_CARE_PROVIDER_SITE_OTHER): Payer: 59 | Admitting: Psychiatry

## 2012-11-07 VITALS — BP 117/74 | HR 86 | Wt 135.6 lb

## 2012-11-07 DIAGNOSIS — M5126 Other intervertebral disc displacement, lumbar region: Secondary | ICD-10-CM | POA: Insufficient documentation

## 2012-11-07 DIAGNOSIS — M549 Dorsalgia, unspecified: Secondary | ICD-10-CM | POA: Insufficient documentation

## 2012-11-07 DIAGNOSIS — F319 Bipolar disorder, unspecified: Secondary | ICD-10-CM

## 2012-11-07 MED ORDER — ALPRAZOLAM 0.25 MG PO TABS: 0.2500 mg | ORAL_TABLET | Freq: Every evening | ORAL | Status: DC | PRN

## 2012-11-07 MED ORDER — DIVALPROEX SODIUM 500 MG PO DR TAB: 500.0000 mg | DELAYED_RELEASE_TABLET | Freq: Every day | ORAL | Status: DC
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
## 2012-11-14 ENCOUNTER — Ambulatory Visit (INDEPENDENT_AMBULATORY_CARE_PROVIDER_SITE_OTHER): Payer: 59 | Admitting: Licensed Clinical Social Worker

## 2012-11-14 DIAGNOSIS — F319 Bipolar disorder, unspecified: Secondary | ICD-10-CM

## 2012-11-14 NOTE — Progress Notes (Signed)
   THERAPIST PROGRESS NOTE  Session Time: 9:30am-10:20am  Participation Level: Active  Behavioral Response: Fairly GroomedAlertAnxious  Type of Therapy: Individual Therapy  Treatment Goals addressed: Coping  Interventions: CBT, DBT, Motivational Interviewing, Strength-based, Supportive and Family Systems  Summary: Gina Barron is a 32 y.o. female who presents with anxious mood and bright affect. She is tired today and reports working seven days in a row with long hours. She expresses continued frustration with her husbands court case because it has been continued again. She is also upset after receiving a letter for truancy court for her youngest daughter. She processes her anger and frustration that her daughter fights to go to school. She did follow through on the previous recommendation of her husband getting their daughter up and ready for school and this has been positive. They have not been able to be consistent with this because of her husbands difficulty with sleep/wake cycle. Patient realizes that she is addicted to her own anxiety. She reports discomfort when she feels relaxed and finds that her mind searches for something to worry about. Her sleep is inconsistent and her appetite is wnl.    Suicidal/Homicidal: Nowithout intent/plan  Therapist Response: Assessed patients current functioning and reviewed progress. Reviewed coping strategies. Assessed patients safety and assisted in identifying protective factors.  Reviewed crisis plan with patient. Assisted patient with the expression of anxiety and frustration. Reviewed patients self care plan. Assessed progress related to self care. Patients self care is fair. Recommend daily exercise, increased socialization and recreation. Used CBT to assist patient with the identification of negative distortions and irrational thoughts. Encouraged patient to verbalize alternative and factual responses which challenge thought distortions. Used  DBT to practice mindfulness, review distraction list and improve distress tolerance skills. Used motivational interviewing to assist and encourage patient through the change process. Explored patients barriers to change.   Plan: Return again in two weeks.  Diagnosis: Axis I: bi polar     Axis II: No diagnosis    Alaysiah Browder, LCSW 11/14/2012

## 2012-11-28 ENCOUNTER — Ambulatory Visit (INDEPENDENT_AMBULATORY_CARE_PROVIDER_SITE_OTHER): Payer: 59 | Admitting: Licensed Clinical Social Worker

## 2012-11-28 DIAGNOSIS — F319 Bipolar disorder, unspecified: Secondary | ICD-10-CM

## 2012-11-28 NOTE — Progress Notes (Signed)
   THERAPIST PROGRESS NOTE  Session Time: 11:30am-12:20pm  Participation Level: Active  Behavioral Response: Fairly GroomedAlertEuthymic  Type of Therapy: Individual Therapy  Treatment Goals addressed: Coping  Interventions: CBT, Motivational Interviewing, Strength-based, Supportive and Reframing  Summary: Gina Barron is a 32 y.o. female who presents with euthymic mood and bright affect. She reports that she is doing well, with some improvement in her anxiety. She is working a lot of hours and expresses some concern about increased irritability around 2-3pm each day. She is unable to identify a trigger for her anger and "just works through it" when it happens. She remains frustrated with her husbands inconsistent response and effort to help their youngest daughter in the morning. On the days he does commit to waking her and taking her to school, conflict is seriously diminished. Her sleep has improved and she is trying to eat a more healthy diet.    Suicidal/Homicidal: Nowithout intent/plan  Therapist Response: Assessed patients current functioning and reviewed progress. Reviewed coping strategies. Assessed patients safety and assisted in identifying protective factors.  Reviewed crisis plan with patient. Assisted patient with the expression of anxiety and frustration. Reviewed patients self care plan. Assessed progress related to self care. Patients self care is fair. Recommend daily exercise, increased socialization and recreation. Used CBT to assist patient with the identification of negative distortions and irrational thoughts. Encouraged patient to verbalize alternative and factual responses which challenge thought distortions. Reviewed healthy boundaries and assertive communication. Used motivational interviewing to assist and encourage patient through the change process. Explored patients barriers to change.   Plan: Return again in two weeks.  Diagnosis: Axis I: bi polar  2    Axis II: No diagnosis    Renad Jenniges, LCSW 11/28/2012

## 2012-12-13 ENCOUNTER — Ambulatory Visit (HOSPITAL_COMMUNITY): Payer: Self-pay | Admitting: Licensed Clinical Social Worker

## 2012-12-13 ENCOUNTER — Encounter (HOSPITAL_COMMUNITY): Payer: Self-pay | Admitting: Licensed Clinical Social Worker

## 2012-12-13 ENCOUNTER — Encounter (HOSPITAL_COMMUNITY): Payer: Self-pay

## 2012-12-13 NOTE — Progress Notes (Signed)
Patient ID: Gina Barron, female   DOB: 03-Feb-1981, 32 y.o.   MRN: [id number] Patient was a no show. Called. Patient thought her appointment was at 11:30am. Instructed to call and make another appointment.

## 2013-01-07 ENCOUNTER — Encounter (HOSPITAL_COMMUNITY): Payer: Self-pay | Admitting: Psychiatry

## 2013-01-07 ENCOUNTER — Ambulatory Visit (INDEPENDENT_AMBULATORY_CARE_PROVIDER_SITE_OTHER): Admitting: Psychiatry

## 2013-01-07 VITALS — BP 116/73 | HR 75 | Ht 64.5 in | Wt 135.4 lb

## 2013-01-07 DIAGNOSIS — F3189 Other bipolar disorder: Secondary | ICD-10-CM

## 2013-01-07 DIAGNOSIS — F319 Bipolar disorder, unspecified: Secondary | ICD-10-CM

## 2013-01-07 MED ORDER — ALPRAZOLAM 0.25 MG PO TABS: 0.2500 mg | ORAL_TABLET | Freq: Every evening | ORAL | Status: DC | PRN

## 2013-01-07 MED ORDER — VALPROATE SODIUM 250 MG/5ML PO SYRP: 500.0000 mg | ORAL_SOLUTION | Freq: Every day | ORAL | Status: DC
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
## 2013-01-09 ENCOUNTER — Ambulatory Visit (INDEPENDENT_AMBULATORY_CARE_PROVIDER_SITE_OTHER): Admitting: Licensed Clinical Social Worker

## 2013-01-09 DIAGNOSIS — F319 Bipolar disorder, unspecified: Secondary | ICD-10-CM

## 2013-01-09 NOTE — Progress Notes (Signed)
   THERAPIST PROGRESS NOTE  Session Time: 11:30am-12:20pm  Participation Level: Active  Behavioral Response: Well GroomedAlertAnxious and Irritable  Type of Therapy: Individual Therapy  Treatment Goals addressed: Coping  Interventions: CBT, Motivational Interviewing, Solution Focused, Strength-based, Supportive and Reframing  Summary: Gina Barron is a 32 y.o. female who presents with anxious mood and frustrated affect. She reports a long absence from treatment because she has not had any days off of work. She reports feeling exhausted and tired with an increase in her irritability. She is being taken to court for truancy for her youngest daughter and she processes her anger and frustration over this. She is doing much better about allowing her husband to get their daughter ready in the morning due to the conflict which ensues between her and her daughter. She expresses anger at the school for ignoring her daughters disabilities and refusing to work with her. There has been little progress related to her husbands court hearing. She does not see her older daughter very often. Her sleep is disrupted and her appetite is wnl.    Suicidal/Homicidal: Nowithout intent/plan  Therapist Response: Assessed patients current functioning and reviewed progress. Reviewed coping strategies. Assessed patients safety and assisted in identifying protective factors.  Reviewed crisis plan with patient. Assisted patient with the expression of frustration and anger. Reviewed patients self care plan. Assessed progress related to self care. Patients self care is fair. Recommend daily exercise, increased socialization and recreation. Used DBT to practice mindfulness, review distraction list and improve distress tolerance skills. Used CBT to assist patient with the identification of negative distortions and irrational thoughts. Encouraged patient to verbalize alternative and factual responses which challenge thought  distortions. Used motivational interviewing to assist and encourage patient through the change process. Explored patients barriers to change.   Plan: Return again in three weeks.  Diagnosis: Axis I: bi polar    Axis II: No diagnosis    Josephanthony Tindel, LCSW 01/09/2013

## 2013-01-20 ENCOUNTER — Telehealth (HOSPITAL_COMMUNITY): Payer: Self-pay

## 2013-01-28 ENCOUNTER — Ambulatory Visit (INDEPENDENT_AMBULATORY_CARE_PROVIDER_SITE_OTHER): Admitting: Psychiatry

## 2013-01-28 ENCOUNTER — Encounter (HOSPITAL_COMMUNITY): Payer: Self-pay | Admitting: Psychiatry

## 2013-01-28 VITALS — BP 107/54 | HR 71 | Ht 64.5 in | Wt 133.0 lb

## 2013-01-28 DIAGNOSIS — F319 Bipolar disorder, unspecified: Secondary | ICD-10-CM

## 2013-01-28 MED ORDER — DIVALPROEX SODIUM 250 MG PO DR TAB: DELAYED_RELEASE_TABLET | ORAL | Status: DC

## 2013-01-28 MED ORDER — ALPRAZOLAM 0.25 MG PO TABS: 0.2500 mg | ORAL_TABLET | Freq: Every evening | ORAL | Status: DC | PRN
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
## 2013-02-04 ENCOUNTER — Ambulatory Visit (HOSPITAL_COMMUNITY): Payer: Self-pay | Admitting: Licensed Clinical Social Worker

## 2013-02-18 ENCOUNTER — Ambulatory Visit (HOSPITAL_COMMUNITY): Payer: Self-pay | Admitting: Licensed Clinical Social Worker

## 2013-02-25 ENCOUNTER — Ambulatory Visit (HOSPITAL_COMMUNITY): Payer: Self-pay | Admitting: Licensed Clinical Social Worker

## 2013-03-11 ENCOUNTER — Ambulatory Visit (HOSPITAL_COMMUNITY): Payer: Self-pay | Admitting: Psychiatry

## 2013-03-19 ENCOUNTER — Other Ambulatory Visit (HOSPITAL_COMMUNITY): Payer: Self-pay | Admitting: Psychiatry

## 2013-03-19 DIAGNOSIS — F319 Bipolar disorder, unspecified: Secondary | ICD-10-CM

## 2013-03-19 MED ORDER — ALPRAZOLAM 0.25 MG PO TABS: 0.2500 mg | ORAL_TABLET | Freq: Every evening | ORAL | Status: DC | PRN

## 2013-03-19 NOTE — Telephone Encounter (Signed)
Xanax was refilled by calling patients pharmacy. Spoke with Dr. Lolly Mustache who agreed to one 30 days supply and to have patient schedule an appointment. Attempted to call patient, no answer.

## 2013-07-30 ENCOUNTER — Other Ambulatory Visit (HOSPITAL_COMMUNITY): Payer: Self-pay | Admitting: Orthopaedic Surgery

## 2013-07-30 DIAGNOSIS — M25551 Pain in right hip: Secondary | ICD-10-CM

## 2013-08-04 ENCOUNTER — Ambulatory Visit (HOSPITAL_COMMUNITY)
Admission: RE | Admit: 2013-08-04 | Discharge: 2013-08-04 | Disposition: A | Discharge status: Home / Self Care | Source: Ambulatory Visit | Attending: Orthopaedic Surgery | Admitting: Orthopaedic Surgery

## 2013-08-04 DIAGNOSIS — M25551 Pain in right hip: Secondary | ICD-10-CM

## 2013-08-04 DIAGNOSIS — M25559 Pain in unspecified hip: Secondary | ICD-10-CM | POA: Insufficient documentation

## 2013-08-05 ENCOUNTER — Other Ambulatory Visit (HOSPITAL_COMMUNITY): Payer: Self-pay | Admitting: Orthopaedic Surgery

## 2013-08-05 ENCOUNTER — Ambulatory Visit (HOSPITAL_COMMUNITY)
Admission: RE | Admit: 2013-08-05 | Discharge: 2013-08-05 | Disposition: A | Discharge status: Home / Self Care | Source: Ambulatory Visit | Attending: Orthopaedic Surgery | Admitting: Orthopaedic Surgery

## 2013-08-05 DIAGNOSIS — M25552 Pain in left hip: Secondary | ICD-10-CM

## 2013-08-05 DIAGNOSIS — M25559 Pain in unspecified hip: Secondary | ICD-10-CM | POA: Insufficient documentation

## 2013-08-30 ENCOUNTER — Emergency Department (HOSPITAL_COMMUNITY)
Admission: EM | Admit: 2013-08-30 | Discharge: 2013-08-31 | Disposition: A | Discharge status: Home / Self Care | Attending: Emergency Medicine | Admitting: Emergency Medicine

## 2013-08-30 DIAGNOSIS — F17200 Nicotine dependence, unspecified, uncomplicated: Secondary | ICD-10-CM | POA: Insufficient documentation

## 2013-08-30 DIAGNOSIS — M659 Synovitis and tenosynovitis, unspecified: Secondary | ICD-10-CM

## 2013-08-30 DIAGNOSIS — G8929 Other chronic pain: Secondary | ICD-10-CM | POA: Insufficient documentation

## 2013-08-30 DIAGNOSIS — M65839 Other synovitis and tenosynovitis, unspecified forearm: Secondary | ICD-10-CM | POA: Insufficient documentation

## 2013-08-30 DIAGNOSIS — M129 Arthropathy, unspecified: Secondary | ICD-10-CM | POA: Insufficient documentation

## 2013-08-30 DIAGNOSIS — M65849 Other synovitis and tenosynovitis, unspecified hand: Principal | ICD-10-CM

## 2013-08-30 DIAGNOSIS — Z91040 Latex allergy status: Secondary | ICD-10-CM | POA: Insufficient documentation

## 2013-08-30 DIAGNOSIS — Z79899 Other long term (current) drug therapy: Secondary | ICD-10-CM | POA: Insufficient documentation

## 2013-08-30 DIAGNOSIS — F411 Generalized anxiety disorder: Secondary | ICD-10-CM | POA: Insufficient documentation

## 2013-08-31 ENCOUNTER — Encounter (HOSPITAL_COMMUNITY): Payer: Self-pay | Admitting: Emergency Medicine

## 2013-08-31 MED ORDER — PREDNISONE 10 MG PO TABS: 20.0000 mg | ORAL_TABLET | Freq: Every day | ORAL | Status: DC

## 2013-08-31 MED ORDER — PREDNISONE 50 MG PO TABS
60.0000 mg | ORAL_TABLET | Freq: Once | ORAL | Status: AC
  Administered 2013-08-31: 01:00:00 60 mg via ORAL
  Filled 2013-08-31 (×2): qty 1

## 2013-08-31 NOTE — ED Provider Notes (Signed)
Medical screening examination/treatment/procedure(s) were performed by non-physician practitioner and as supervising physician I was immediately available for consultation/collaboration.   Dione Boozeavid Takeyah Wieman, MD 08/31/13 [phone]

## 2013-08-31 NOTE — Discharge Instructions (Signed)
Tendinitis and Tenosynovitis   Tendinitis is inflammation of the tendon. Tenosynovitis is inflammation of the lining around the tendon (tendon sheath). These painful conditions often occur at once. Tendons attach muscle to bone. To move a limb, force from the muscle moves through the tendon, to the bone. These conditions often cause increased pain when moving. Tendinitis may be caused by a small or partial tear in the tendon.   SYMPTOMS   · Pain, tenderness, redness, bruising, or swelling at the injury.  · Loss of normal joint movement.  · Pain that gets worse with use of the muscle and joint attached to the tendon.  · Weakness in the tendon, caused by calcium build up that may occur with tendinitis.  · Commonly affected tendons:  · Achilles tendon (calf of leg).  · Rotator cuff (shoulder joint).  · Patellar tendon (kneecap to shin).  · Peroneal tendon (ankle).  · Posterior tibial tendon (inner ankle).  · Biceps tendon (in front of shoulder).  CAUSES   · Sudden strain on a flexed muscle, muscle overuse, sudden increase or change in activity, vigorous activity.  · Result of a direct hit (less common).  · Poor muscle action (biomechanics).  RISK INCREASES WITH:  · Injury (trauma).  · Too much exercise.  · Sudden change in athletic activity.  · Incorrect exercise form or technique.  · Poor strength and flexibility.  · Not warming-up properly before activity.  · Returning to activity before healing is complete.  PREVENTION   · Warm-up and stretch properly before activity.  · Maintain physical fitness:  · Joint flexibility.  · Muscle strength and endurance.  · Fitness that increases heart rate.  · Learn and use proper exercise techniques.  · Use rehabilitation exercises to strengthen weak muscles and tendons.  · Ice the tendon after activity, to reduce recurring inflammation.  · Wear proper fitting protective equipment for specific tendons, when indicated.  PROGNOSIS   When treated properly, can be cured in 6 to 8 weeks.  Recovery may take longer, depending on degree of injury.   RELATED COMPLICATIONS   · Re-injury or recurring symptoms.  · Permanent weakness or joint stiffness, if injury is severe and recovery is not completed.  · Delayed healing, if sports are started before healing is complete.  · Tearing apart (rupture) of the inflamed tendon. Tendinitis means the tendon is injured and must recover.  TREATMENT   Treatment first involves ice, medicine, and rest from aggravating activities. This reduces pain and inflammation. Modifying your activity may be considered to prevent recurring injury. A brace, elastic bandage wrap, splint, cast, or sling may be prescribed to protect the joint for a short period. After that period, strengthening and stretching exercise may help to regain strength and full range of motion. If the condition persists, despite non-surgical treatment, surgery may be recommended to remove the inflamed tendon lining. Corticosteroid injections may be given to reduce inflammation. However, these injections may weaken the tendon and increase your risk for tendon rupture.  MEDICATION   · If pain medicine is needed, nonsteroidal anti-inflammatory medicines (aspirin and ibuprofen), or other minor pain relievers (acetaminophen), are often recommended.  · Do not take pain medicine for 7 days before surgery.  · Prescription pain relievers are usually prescribed only after surgery. Use only as directed and only as much as you need.  · Ointments applied to the skin may be helpful.  · Corticosteroid injections may be given to reduce inflammation. However, this may increase   your risk of a tendon rupture.  HEAT AND COLD  · Cold treatment (icing) relieves pain and reduces inflammation. Cold treatment should be applied for 10 to 15 minutes every 2 to 3 hours, and immediately after activity that aggravates your symptoms. Use ice packs or an ice massage.  · Heat treatment may be used before performing stretching and strengthening  activities prescribed by your caregiver, physical therapist, or athletic trainer. Use a heat pack or a warm water soak.  SEEK MEDICAL CARE IF:   · Symptoms get worse or do not improve, despite treatment.  · Pain becomes too much to tolerate.  · You develop numbness or tingling.  · Toes become cold, or toenails become blue, gray, or dark colored.  · New, unexplained symptoms develop. (Drugs used in treatment may produce side effects.)  Document Released: 08/07/2005 Document Revised: 10/30/2011 Document Reviewed: 11/19/2008  [employer]® Patient Information ©2014 [employer].

## 2013-08-31 NOTE — ED Provider Notes (Signed)
CSN: [id number]     Arrival date & time 08/30/13  2352 History   First MD Initiated Contact with Patient 08/31/13 0041     Chief Complaint  Patient presents with  . Hand Pain   (Consider location/radiation/quality/duration/timing/severity/associated sxs/prior Treatment) HPI Patient presents to the ER for evaluation of her right hand pain. She has been having pain in that hand for 2 years due to Tenosynovitis, Diagnosed on MRI in 2013, and is managed by Dr. Hilda Lias. She says that she was holding a coke can when she heard a pop and felt some swelling. The pain feels like the normal pain she gets when her hand "pops". It does not hurt to touch but it hurts to move. No redness, no induration. Denies any other injuries or complaints. Past Medical History  Diagnosis Date  . Arthritis   . Anxiety   . Back pain   . Lumbar herniated disc    Past Surgical History  Procedure Laterality Date  . Tonsillectomy     Family History  Problem Relation Age of Onset  . Osteoarthritis Mother   . Heart failure Mother   . Hyperlipidemia Mother   . Hypertension Mother   . Migraines Mother   . Alcohol abuse Mother   . Drug abuse Mother   . Depression Mother   . Emphysema Other   . Cancer Other   . Diabetes Other   . Alcohol abuse Father   . Drug abuse Father   . Depression Father   . Alcohol abuse Sister   . Drug abuse Sister   . Depression Sister   . Depression Maternal Grandmother    History  Substance Use Topics  . Smoking status: Current Every Day Smoker -- 1.50 packs/day    Types: Cigarettes  . Smokeless tobacco: Never Used  . Alcohol Use: No   OB History   Grav Para Term Preterm Abortions TAB SAB Ect Mult Living   3 2 2  1 1    2      Review of Systems The patient denies anorexia, fever, weight loss,, vision loss, decreased hearing, hoarseness, chest pain, syncope, dyspnea on exertion, peripheral edema, balance deficits, hemoptysis, abdominal pain, melena, hematochezia, severe  indigestion/heartburn, hematuria, incontinence, genital sores, muscle weakness, suspicious skin lesions, transient blindness, difficulty walking, depression, unusual weight change, abnormal bleeding, enlarged lymph nodes, angioedema, and breast masses.  Allergies  Latex  Home Medications   Current Outpatient Rx  Name  Route  Sig  Dispense  Refill  . divalproex (DEPAKOTE) 250 MG DR tablet      Take 2 tab daily   60 tablet   1   . HYDROcodone-acetaminophen (VICODIN) 5-500 MG per tablet   Oral   Take 1 tablet by mouth daily as needed.          . naproxen sodium (ALEVE) 220 MG tablet   Oral   Take 220 mg by mouth 2 (two) times daily as needed. Arthritis  pain          . ALPRAZolam (XANAX) 0.25 MG tablet   Oral   Take 1 tablet (0.25 mg total) by mouth at bedtime as needed for anxiety.   30 tablet   0     No early refill.   . predniSONE (DELTASONE) 10 MG tablet   Oral   Take 2 tablets (20 mg total) by mouth daily.   21 tablet   0     Prednisone dose pack directions:   6 tabs on day .Marland KitchenMarland Kitchen  BP 104/70  Pulse 70  Temp(Src) 98 F (36.7 C) (Oral)  Resp 18  SpO2 97%  LMP 07/18/2013 Physical Exam  Nursing note and vitals reviewed. Constitutional: She appears well-developed and well-nourished. No distress.  HENT:  Head: Normocephalic and atraumatic.  Eyes: Pupils are equal, round, and reactive to light.  Neck: Normal range of motion. Neck supple.  Cardiovascular: Normal rate and regular rhythm.   Pulmonary/Chest: Effort normal.  Abdominal: Soft.  Musculoskeletal:       Right hand: She exhibits tenderness and swelling. She exhibits normal range of motion, no bony tenderness, normal two-point discrimination, normal capillary refill, no deformity and no laceration. Normal sensation noted. Normal strength noted.  Neurological: She is alert.  Skin: Skin is warm and dry.    ED Course  Procedures (including critical care time) Labs Review Labs Reviewed - No data to  display Imaging Review No results found.  EKG Interpretation   None       MDM   1. Tenosynovitis    Patient with chronic wrist pain being managed by Dr. Hilda LiasKeeling. She denies needed any pain medication She gets prednisone injections when she has exacerbations Will start her on prednisone dose pack. I have given her a wrist splint, this is for comfort and to only use at night We do not want the patients wrist to become week so she has been instructed to not use it allt he time.  32 y.o.Lestine MountKrystal M Cale's evaluation in the Emergency Department is complete. It has been determined that no acute conditions requiring further emergency intervention are present at this time. The patient/guardian have been advised of the diagnosis and plan. We have discussed signs and symptoms that warrant return to the ED, such as changes or worsening in symptoms.  Vital signs are stable at discharge. Filed Vitals:   08/31/13 0008  BP: 104/70  Pulse: 70  Temp: 98 F (36.7 C)  Resp: 18    Patient/guardian has voiced understanding and agreed to follow-up with the PCP or specialist.     Dorthula Matasiffany G Macee Venables, PA-C 08/31/13 1638

## 2013-08-31 NOTE — ED Notes (Signed)
Pt states she has already been seeing dr Hilda Liaskeeling for problems with her right hand, states tonight the swelling worsened and she is having more pain, no new injury noted.

## 2014-01-20 ENCOUNTER — Other Ambulatory Visit (HOSPITAL_COMMUNITY)
Admission: RE | Admit: 2014-01-20 | Discharge: 2014-01-20 | Disposition: A | Discharge status: Home / Self Care | Source: Ambulatory Visit | Attending: Adult Health | Admitting: Adult Health

## 2014-01-20 ENCOUNTER — Encounter: Payer: Self-pay | Admitting: Adult Health

## 2014-01-20 ENCOUNTER — Ambulatory Visit (INDEPENDENT_AMBULATORY_CARE_PROVIDER_SITE_OTHER): Payer: Self-pay | Admitting: Adult Health

## 2014-01-20 VITALS — BP 132/78 | HR 76 | Ht 64.0 in | Wt 132.5 lb

## 2014-01-20 DIAGNOSIS — Z1151 Encounter for screening for human papillomavirus (HPV): Secondary | ICD-10-CM | POA: Insufficient documentation

## 2014-01-20 DIAGNOSIS — E049 Nontoxic goiter, unspecified: Secondary | ICD-10-CM

## 2014-01-20 DIAGNOSIS — Z01419 Encounter for gynecological examination (general) (routine) without abnormal findings: Secondary | ICD-10-CM

## 2014-01-20 DIAGNOSIS — Z3009 Encounter for other general counseling and advice on contraception: Secondary | ICD-10-CM

## 2014-01-20 HISTORY — DX: Encounter for other general counseling and advice on contraception: Z30.09

## 2014-01-20 HISTORY — DX: Nontoxic goiter, unspecified: E04.9

## 2014-01-20 NOTE — Patient Instructions (Signed)
Check on your insurance Return in 1 week for fasting labs and ros and thyroid US Physical in 1 year  Mammogram at 40  Intrauterine Device Insertion Most often, an intrauterine device (IUD) is inserted into the uterus to prevent pregnancy. There are 2 types of IUDs available:  Copper IUD This type of IUD creates an environment that is not favorable to sperm survival. The mechanism of action of the copper IUD is not known for certain. It can stay in place for 10 years.  Hormone IUD This type of IUD contains the hormone progestin (synthetic progesterone). The progestin thickens the cervical mucus and prevents sperm from entering the uterus, and it also thins the uterine lining. There is no evidence that the hormone IUD prevents implantation. One hormone IUD can stay in place for up to 5 years, and a different hormone IUD can stay in place for up to 3 years. An IUD is the most cost-effective birth control if left in place for the full duration. It may be removed at any time. LET [facility] CARE PROVIDER KNOW ABOUT:  Any allergies you have.  All medicines you are taking, including vitamins, herbs, eye drops, creams, and over-the-counter medicines.  Previous problems you or members of your family have had with the use of anesthetics.  Any blood disorders you have.  Previous surgeries you have had.  Possibility of pregnancy.  Medical conditions you have. RISKS AND COMPLICATIONS  Generally, intrauterine device insertion is a safe procedure. However, as with any procedure, complications can occur. Possible complications include:  Accidental puncture (perforation) of the uterus.  Accidental placement of the IUD either in the muscle layer of the uterus (myometrium) or outside the uterus. If this happens, the IUD can be found essentially floating around the bowels and must be taken out surgically.  The IUD may fall out of the uterus (expulsion). This is more common in women who have recently  had a child.   Pregnancy in the fallopian tube (ectopic).  Pelvic inflammatory disease (PID), which is infection of the uterus and fallopian tubes. The risk of PID is slightly increased in the first 20 days after the IUD is placed, but the overall risk is still very low. BEFORE THE PROCEDURE  Schedule the IUD insertion for when you will have your menstrual period or right after, to make sure you are not pregnant. Placement of the IUD is better tolerated shortly after a menstrual cycle.  You may need to take tests or be examined to make sure you are not pregnant.  You may be required to take a pregnancy test.  You may be required to get checked for sexually transmitted infections (STIs) prior to placement. Placing an IUD in someone who has an infection can make the infection worse.  You may be given a pain reliever to take 1 or 2 hours before the procedure.  An exam will be performed to determine the size and position of your uterus.  Ask your health care provider about changing or stopping your regular medicines. PROCEDURE   A tool (speculum) is placed in the vagina. This allows your health care provider to see the lower part of the uterus (cervix).  The cervix is prepped with a medicine that lowers the risk of infection.  You may be given a medicine to numb each side of the cervix (intracervical or paracervical block). This is used to block and control any discomfort with insertion.  A tool (uterine sound) is inserted into the uterus  to determine the length of the uterine cavity and the direction the uterus may be tilted.  A slim instrument (IUD inserter) is inserted through the cervical canal and into your uterus.  The IUD is placed in the uterine cavity and the insertion device is removed.  The nylon string that is attached to the IUD and used for eventual IUD removal is trimmed. It is trimmed so that it lays high in the vagina, just outside the cervix. AFTER THE  PROCEDURE  You may have bleeding after the procedure. This is normal. It varies from light spotting for a few days to menstrual-like bleeding.  You may have mild cramping. Document Released: 04/05/2011 Document Revised: 05/28/2013 Document Reviewed: 01/26/2013 [facility] Patient Information 2014 [place], [place]. Intrauterine Device Information An intrauterine device (IUD) is inserted into your uterus to prevent pregnancy. There are two types of IUDs available:   Copper IUD This type of IUD is wrapped in copper wire and is placed inside the uterus. Copper makes the uterus and fallopian tubes produce a fluid that kills sperm. The copper IUD can stay in place for 10 years.  Hormone IUD This type of IUD contains the hormone progestin (synthetic progesterone). The hormone thickens the cervical mucus and prevents sperm from entering the uterus. It also thins the uterine lining to prevent implantation of a fertilized egg. The hormone can weaken or kill the sperm that get into the uterus. One type of hormone IUD can stay in place for 5 years, and another type can stay in place for 3 years. Your health care provider will make sure you are a good candidate for a contraceptive IUD. Discuss with your health care provider the possible side effects.  ADVANTAGES OF AN INTRAUTERINE DEVICE  IUDs are highly effective, reversible, long acting, and low maintenance.   There are no estrogen-related side effects.   An IUD can be used when breastfeeding.   IUDs are not associated with weight gain.   The copper IUD works immediately after insertion.   The hormone IUD works right away if inserted within 7 days of your period starting. You will need to use a backup method of birth control for 7 days if the hormone IUD is inserted at any other time in your cycle.  The copper IUD does not interfere with your female hormones.   The hormone IUD can make heavy menstrual periods lighter and decrease cramping.    The hormone IUD can be used for 3 or 5 years.   The copper IUD can be used for 10 years. DISADVANTAGES OF AN INTRAUTERINE DEVICE  The hormone IUD can be associated with irregular bleeding patterns.   The copper IUD can make your menstrual flow heavier and more painful.   You may experience cramping and vaginal bleeding after insertion.  Document Released: 07/11/2004 Document Revised: 04/09/2013 Document Reviewed: 01/26/2013 [facility] Patient Information 2014 [place], [place]. IUD PLACEMENT POST-PROCEDURE INSTRUCTIONS  1. You may take Ibuprofen, Aleve or Tylenol for pain if needed.  Cramping should resolve within in 24 hours.  2. You may have a small amount of spotting.  You should wear a mini pad for the next few days.  3. You may have intercourse after 24 hours.  If you using this for birth control, it is effective immediately.  4. You need to call if you have any pelvic pain, fever, heavy bleeding or foul smelling vaginal discharge.  Irregular bleeding is common the first several months after having an IUD placed. You do not  need to call for this reason unless you are concerned.  5. Shower or bathe as normal  6. You should have a follow-up appointment in 4-8 weeks for a re-check to make sure you are not having any problems.
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
## 2014-01-23 LAB — CYTOLOGY - PAP

## 2014-01-27 ENCOUNTER — Other Ambulatory Visit

## 2014-04-10 ENCOUNTER — Ambulatory Visit (HOSPITAL_COMMUNITY): Payer: Self-pay | Admitting: Psychiatry

## 2014-05-25 ENCOUNTER — Encounter (INDEPENDENT_AMBULATORY_CARE_PROVIDER_SITE_OTHER): Payer: Self-pay

## 2014-05-25 ENCOUNTER — Encounter (HOSPITAL_COMMUNITY): Payer: Self-pay | Admitting: Psychiatry

## 2014-05-25 ENCOUNTER — Ambulatory Visit (INDEPENDENT_AMBULATORY_CARE_PROVIDER_SITE_OTHER): Admitting: Psychiatry

## 2014-05-25 VITALS — BP 137/81 | HR 75 | Ht 64.0 in | Wt 132.8 lb

## 2014-05-25 DIAGNOSIS — F3181 Bipolar II disorder: Secondary | ICD-10-CM

## 2014-05-25 DIAGNOSIS — F319 Bipolar disorder, unspecified: Secondary | ICD-10-CM

## 2014-05-25 MED ORDER — LAMOTRIGINE 25 MG PO TABS: ORAL_TABLET | ORAL | Status: DC

## 2014-05-25 MED ORDER — ALPRAZOLAM 0.25 MG PO TABS: 0.2500 mg | ORAL_TABLET | Freq: Every evening | ORAL | Status: DC | PRN
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
## 2014-06-12 LAB — CBC WITH DIFFERENTIAL/PLATELET
Basophils Absolute: 0.1 10*3/uL (ref 0.0–0.1)
Basophils Relative: 1 % (ref 0–1)
Eosinophils Absolute: 0.1 10*3/uL (ref 0.0–0.7)
Eosinophils Relative: 2 % (ref 0–5)
HCT: 39.7 % (ref 36.0–46.0)
Hemoglobin: 13.5 g/dL (ref 12.0–15.0)
Lymphocytes Relative: 35 % (ref 12–46)
Lymphs Abs: 2.3 10*3/uL (ref 0.7–4.0)
MCH: 31.2 pg (ref 26.0–34.0)
MCHC: 34 g/dL (ref 30.0–36.0)
MCV: 91.7 fL (ref 78.0–100.0)
MONOS PCT: 7 % (ref 3–12)
Monocytes Absolute: 0.5 10*3/uL (ref 0.1–1.0)
NEUTROS ABS: 3.7 10*3/uL (ref 1.7–7.7)
NEUTROS PCT: 55 % (ref 43–77)
Platelets: 205 10*3/uL (ref 150–400)
RBC: 4.33 MIL/uL (ref 3.87–5.11)
RDW: 12.6 % (ref 11.5–15.5)
WBC: 6.7 10*3/uL (ref 4.0–10.5)

## 2014-06-12 LAB — COMPREHENSIVE METABOLIC PANEL
ALBUMIN: 4.7 g/dL (ref 3.5–5.2)
ALT: 13 U/L (ref 0–35)
AST: 17 U/L (ref 0–37)
Alkaline Phosphatase: 54 U/L (ref 39–117)
BUN: 10 mg/dL (ref 6–23)
CHLORIDE: 106 meq/L (ref 96–112)
CO2: 27 meq/L (ref 19–32)
Calcium: 9.2 mg/dL (ref 8.4–10.5)
Creat: 0.86 mg/dL (ref 0.50–1.10)
GLUCOSE: 105 mg/dL — AB (ref 70–99)
POTASSIUM: 3.9 meq/L (ref 3.5–5.3)
Sodium: 140 mEq/L (ref 135–145)
Total Bilirubin: 0.5 mg/dL (ref 0.2–1.2)
Total Protein: 6.8 g/dL (ref 6.0–8.3)

## 2014-06-12 LAB — HEMOGLOBIN A1C
HEMOGLOBIN A1C: 4.9 % (ref <5.7)
Mean Plasma Glucose: 94 mg/dL (ref <117)

## 2014-06-12 LAB — TSH: TSH: 1.129 u[IU]/mL (ref 0.350–4.500)

## 2014-06-16 ENCOUNTER — Ambulatory Visit (INDEPENDENT_AMBULATORY_CARE_PROVIDER_SITE_OTHER): Admitting: Psychiatry

## 2014-06-16 ENCOUNTER — Encounter (HOSPITAL_COMMUNITY): Payer: Self-pay | Admitting: Psychiatry

## 2014-06-16 VITALS — BP 124/70 | HR 68 | Ht 64.0 in | Wt 137.2 lb

## 2014-06-16 DIAGNOSIS — F3181 Bipolar II disorder: Secondary | ICD-10-CM

## 2014-06-16 DIAGNOSIS — F319 Bipolar disorder, unspecified: Secondary | ICD-10-CM

## 2014-06-16 MED ORDER — LAMOTRIGINE 100 MG PO TABS: 100.0000 mg | ORAL_TABLET | Freq: Every day | ORAL | Status: DC

## 2014-06-16 MED ORDER — HYDROXYZINE PAMOATE 25 MG PO CAPS: 25.0000 mg | ORAL_CAPSULE | ORAL | Status: DC | PRN
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
## 2014-06-22 ENCOUNTER — Encounter (HOSPITAL_COMMUNITY): Payer: Self-pay | Admitting: Psychiatry

## 2014-06-23 ENCOUNTER — Ambulatory Visit (HOSPITAL_COMMUNITY): Payer: Self-pay | Admitting: Psychology

## 2014-06-24 ENCOUNTER — Encounter: Payer: Self-pay | Admitting: Psychiatry

## 2014-07-09 ENCOUNTER — Ambulatory Visit (INDEPENDENT_AMBULATORY_CARE_PROVIDER_SITE_OTHER): Admitting: Psychology

## 2014-07-09 ENCOUNTER — Encounter (HOSPITAL_COMMUNITY): Payer: Self-pay | Admitting: Psychology

## 2014-07-09 DIAGNOSIS — F319 Bipolar disorder, unspecified: Secondary | ICD-10-CM

## 2014-07-14 ENCOUNTER — Ambulatory Visit (INDEPENDENT_AMBULATORY_CARE_PROVIDER_SITE_OTHER): Admitting: Psychiatry

## 2014-07-14 ENCOUNTER — Encounter (HOSPITAL_COMMUNITY): Payer: Self-pay | Admitting: Psychiatry

## 2014-07-14 VITALS — BP 134/83 | HR 73 | Ht 64.0 in | Wt 138.6 lb

## 2014-07-14 DIAGNOSIS — F3181 Bipolar II disorder: Secondary | ICD-10-CM

## 2014-07-14 DIAGNOSIS — F319 Bipolar disorder, unspecified: Secondary | ICD-10-CM

## 2014-07-14 MED ORDER — LAMOTRIGINE 150 MG PO TABS: 150.0000 mg | ORAL_TABLET | Freq: Every day | ORAL | Status: DC

## 2014-07-14 MED ORDER — HYDROXYZINE PAMOATE 50 MG PO CAPS: 50.0000 mg | ORAL_CAPSULE | ORAL | Status: DC | PRN
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
## 2014-07-23 ENCOUNTER — Telehealth (HOSPITAL_COMMUNITY): Payer: Self-pay | Admitting: *Deleted

## 2014-07-23 ENCOUNTER — Telehealth (HOSPITAL_COMMUNITY): Payer: Self-pay | Admitting: Psychiatry

## 2014-07-23 NOTE — Telephone Encounter (Signed)
I returned patient's phone call.  She is complaining of increased anxiety.  She is not sure what causing but she is having panic attack every day.  I recommended to try Vistaril in the morning.  She is already taking at night and she will take 50 mg in the morning and 50 at bedtime.  She has enough for at least 2 weeks.  She will call us back if she does not feel any improvement.

## 2014-07-23 NOTE — Telephone Encounter (Signed)
Patient left VM requesting call back concerning her medication  Phoned patient: She states her stress and anxiety are worse, she is experiencing increase in panic attacks. Currently taking Vistaril once daily at bedtime. Is there another medicine she can take, should she increase her medicine or should she come see him before January? Wants to talk with Dr. Lolly MustacheArfeen.

## 2014-08-13 ENCOUNTER — Telehealth (HOSPITAL_COMMUNITY): Payer: Self-pay | Admitting: *Deleted

## 2014-08-13 NOTE — Telephone Encounter (Signed)
Pt called stating that Dr. Lolly MustacheArfeen told her to double up on her medication (Vistaril) for panic attacks recently but it "did nothing for me." Asking for something else to help.

## 2014-08-25 ENCOUNTER — Telehealth (HOSPITAL_COMMUNITY): Payer: Self-pay | Admitting: *Deleted

## 2014-08-25 NOTE — Telephone Encounter (Signed)
Pt called stating "I am pregnant" concerned about taking Lamictal and vistaril. Informed pt per Dr Lolly MustacheArfeen not to take Lamictal and to follow up with her OBGYN in reference to Vistaril. Pt says OBGYN has told her not to take vistaril. Pt has appt scheduled with Dr. Lolly MustacheArfeen for 1/19, pt transferred to scheduler for earlier appt.

## 2014-08-27 ENCOUNTER — Encounter (HOSPITAL_COMMUNITY): Payer: Self-pay | Admitting: Psychiatry

## 2014-08-27 ENCOUNTER — Ambulatory Visit (INDEPENDENT_AMBULATORY_CARE_PROVIDER_SITE_OTHER): Admitting: Psychiatry

## 2014-08-27 VITALS — BP 127/78 | HR 78 | Wt 131.8 lb

## 2014-08-27 DIAGNOSIS — F3181 Bipolar II disorder: Secondary | ICD-10-CM

## 2014-08-27 DIAGNOSIS — F3131 Bipolar disorder, current episode depressed, mild: Secondary | ICD-10-CM
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
## 2014-09-04 ENCOUNTER — Ambulatory Visit (HOSPITAL_COMMUNITY): Payer: Self-pay | Admitting: Psychology

## 2014-09-08 ENCOUNTER — Ambulatory Visit (HOSPITAL_COMMUNITY): Payer: Self-pay | Admitting: Psychiatry

## 2014-09-14 ENCOUNTER — Ambulatory Visit (HOSPITAL_COMMUNITY): Payer: Self-pay | Admitting: Psychiatry

## 2014-09-17 ENCOUNTER — Ambulatory Visit (INDEPENDENT_AMBULATORY_CARE_PROVIDER_SITE_OTHER): Admitting: Psychology

## 2014-09-17 DIAGNOSIS — F319 Bipolar disorder, unspecified: Secondary | ICD-10-CM

## 2014-09-18 NOTE — Progress Notes (Signed)
   THERAPIST PROGRESS NOTE  Session Time: 3.35pm-4.30pm  Participation Level: Active  Behavioral Response: Well GroomedAlertAnxious  Type of Therapy: Individual Therapy  Treatment Goals addressed: Diagnosis: Bipolar 1 D/O  and goal 1.  Interventions: Supportive and Other: tx planning and psychoeducation  Summary: Gina Barron is a 34 y.o. female who presents with report of anxiety that has been severe. Pt reported that she is pregnant and so was taking off her medication and now feeling "all over the place".  Pt reported that she feels that she does need some medication even aware of risks and hopes that her OBGYN will be willing to prescribe- she sees him next week.  Pt reports she is about [redacted] weeks pregnant despite being on Mirena.  Pt also reported that since last seen she lost her job, [place] and her car was reposed as loan stipulated not live outside of [place]  Pt reports that she is not going out of the house besides driving her daughter to school and going to appointments.  Pt feels that has good support from her boyfriend although aware enables her avoidance w/ anxiety.  Pt discussed her goals and want for tx.  Pt aware that she will need to work w/ training relaxation response to assist in managing anxiety to begin approaching things she avoids.     Suicidal/Homicidal: Nowithout intent/plan  Therapist Response: Assessed pt current functioning per pt report.  Explored w/pt mood and changes since stopping medication and concerns pt has w/ mood instability.  Encouraged pt to f/u w/ OBGYN and then Dr. Lolly MustacheArfeen if needed.  Discussed her goals for tx and discussed tx modalities and coping skills will be focusing w/ mindfulness and eliciting relaxation response.   Plan: Return again in 1 weeks.  Diagnosis: Bipolar 1 D/O unspecified    Darnell Jeschke, LPC 09/18/2014

## 2014-09-24 ENCOUNTER — Ambulatory Visit (INDEPENDENT_AMBULATORY_CARE_PROVIDER_SITE_OTHER): Admitting: Psychology

## 2014-09-24 DIAGNOSIS — F319 Bipolar disorder, unspecified: Secondary | ICD-10-CM

## 2014-09-24 NOTE — Progress Notes (Signed)
   THERAPIST PROGRESS NOTE  Session Time: 3.36pm-4.25pm  Participation Level: Active  Behavioral Response: Well GroomedAlertAnxious  Type of Therapy: Individual Therapy  Treatment Goals addressed: Diagnosis: Bipolar 1 D/O and goal 1.  Interventions: CBT and Meditation: mindfulness training  Summary: Tedra SenegalKrystal M Mcnear is a 34 y.o. female who presents with report of continued increased anxiety.  Pt reports that she went to her first OB visit today and that they are not sure how far along she is believing somewhere between 9-13 weeks.  Pt reported that she will f/u w/ OB on 10/05/14 and then will discuss w/ the doctor what medications to take then.  Today she was advised to continue the medications she has been taking.  Pt reported that since stopping the Lamictal her mood has been less stable.  Pt reports about 1 time a week having hysterical laughing spells for about 2 hours, followed by panic attack as can't catch her breath.  Pt was receptive to mindfulness techniques modeled and discussed but wasn't ready to practice herself. Pt reported she has tried several things in the past and has struggled w/ racing thoughts and keeping focused on intent.  Pt agrees to attempt prior to next visit.    Suicidal/Homicidal: Nowithout intent/plan  Therapist Response: Assessed pt current functioning per pt report.  Processed w/ pt her mood and explored w/pt lability of mood.  Provided Psychoeducational re: mindfulness, modeled techniques for pt.  Also demonstrated grounding postures.  Encouraged pt use and discussed need for consistent practice and not to feel failed if hasn't been able to "turn off other thoughts" but just bring focus back to intention when noticed.   Plan: Return again in 1 weeks. F/u w/ OB as scheduled.  Diagnosis: Bipolar 1 D/O    Forde RadonYATES,LEANNE, [facility] 09/24/2014

## 2014-10-01 ENCOUNTER — Ambulatory Visit (INDEPENDENT_AMBULATORY_CARE_PROVIDER_SITE_OTHER): Admitting: Psychology

## 2014-10-01 DIAGNOSIS — F319 Bipolar disorder, unspecified: Secondary | ICD-10-CM

## 2014-10-01 NOTE — Progress Notes (Signed)
   THERAPIST PROGRESS NOTE  Session Time: 3.38pm-4.20pm  Participation Level: Active  Behavioral Response: Well GroomedAlertAnxious and Irritable  Type of Therapy: Individual Therapy  Treatment Goals addressed: Diagnosis: Bipolar 1 D/O and goal 1.  Interventions: DBT and Supportive  Summary: Gina Barron is a 34 y.o. female who presents with report of feeling very tired.  Pt reported she didn't sleep last night as experienced a panic attack for 4 hours.  Pt reports that trigger was the feeling of falling that occurs when going to sleep.  Pt reported that she hasn't been able to sleep any today either as was working on registering new vehicles.  Pt denied any manic periods. Pt reports that mood swings from depressed to anxiety.  Pt also endorsed easily irritable and so avoiding phones and interactions w/ others.  Pt reports boyfriend as support and helpful.  Pt continues to feel as is she has 2 minds- the rational mind and emotions and the emotions are in control.  Pt receptive to the idea of wise mind and working on developing mindfulness- but also seemed defeatist that have already tried.  Pt reports she has her appointment w/ OB on 10/05/14 and had to go back in for blood work and urinalysis as proteins in urine higher than should be.   Suicidal/Homicidal: Nowithout intent/plan  Therapist Response: Assessed pt current functioning per pt report.  Processed w/ pt her moods and explored w/pt use of supports and reiterated attempting best self care to assist in managing moods as no medications current.  Introduced pt to wise mind concept from DBT and discussed use of mindfulness to assist in developing wise mind for approaching stressors.   Plan: Return again in 1 weeks. Pt to f/u w/ her OB on 09/04/14 re: medications for Bipolar D/O.   Diagnosis: Bipolar 1 D/O unspecified    YATES,LEANNE, LPC 10/01/2014

## 2014-10-08 ENCOUNTER — Telehealth (HOSPITAL_COMMUNITY): Payer: Self-pay | Admitting: Psychology

## 2014-10-08 ENCOUNTER — Ambulatory Visit (INDEPENDENT_AMBULATORY_CARE_PROVIDER_SITE_OTHER): Admitting: Psychology

## 2014-10-08 DIAGNOSIS — F319 Bipolar disorder, unspecified: Secondary | ICD-10-CM

## 2014-10-08 NOTE — Telephone Encounter (Signed)
Informed pt that counselor needs to leave early and so won't be able to keep 3:30pm appointment time- but can see her at 1:30pm instead.  Pt agreed and will come in for 1:30pm appointment today.

## 2014-10-08 NOTE — Progress Notes (Signed)
   THERAPIST PROGRESS NOTE  Session Time: 1.30pm-2.10pm  Participation Level: Active  Behavioral Response: Well GroomedAlert, tired  Type of Therapy: Individual Therapy  Treatment Goals addressed: Diagnosis: Bipolar 1 D/O and goal 1.  Interventions: CBT and Other: mindfulness  Summary: Gina Barron is a 34 y.o. female who presents with report of feeling tired today.  Pt reported that she also feels foggy and not very present- "shut down" and feels that due to busy day w/ appointments.  Pt reported she went and met w/ her OBGYN today and had ultrasound which determined she is almost [redacted] weeks pregnant.  Pt reported that doctor informed that it is now safe for her to take Lamictal and she can restart that, but only want her to use vistaril sparingly.  Pt informed that she has daily panic attacks and would need a medication that could be taking daily.  OBGYN wants her to continue w/ Dr. Adele Schilder for medications and will consult with each other as needed.  Pt reported that visit to her bring her daughter to ex went well- she was able to pull over when anxiety peaked driving.  Pt discussed benefits of moving into house to rent and out of hotel- give more space, feel more secure and feel that she is in her "own" space.  Pt agreed for use of getting outside to focus on practice mindfulness and discussed current challenges w/ current housing- but does go to husband place of employment for this at times.   Suicidal/Homicidal: Nowithout intent/plan  Therapist Response: Assessed pt current functioning per pt report.  Processed w/pt her report of mood and thoughts and discussed as bodies defensive/coping.  Encouraged use of mindfulness practice and how to use outdoor spaces that don't have interruptions from others- man made thigns to practice.    Plan: Return again in 1 weeks.  Diagnosis: Bipolar 1 unspecified    YATES,LEANNE, LPC 10/08/2014

## 2014-10-15 ENCOUNTER — Ambulatory Visit (INDEPENDENT_AMBULATORY_CARE_PROVIDER_SITE_OTHER): Admitting: Psychology

## 2014-10-15 DIAGNOSIS — F319 Bipolar disorder, unspecified: Secondary | ICD-10-CM

## 2014-10-15 NOTE — Progress Notes (Signed)
   THERAPIST PROGRESS NOTE  Session Time: 3.33pm-4.25pm  Participation Level: Active  Behavioral Response: Well GroomedAlert, AFFECT wNL  Type of Therapy: Individual Therapy  Treatment Goals addressed: Diagnosis: Bipolar 1 D/O and goal 1.  Interventions: CBT, DBT and Supportive  Summary: Gina Barron is a 34 y.o. female who presents with report of continued anxiety and feeling easily distracted and difficulty following through with a routine, plan or schedule for self.  Pt reported that she has been watching netflix almost nonstop- starting a show and feeling as need to watch the full show almost w/out interruption. Pt reported she feels tired a lot as well.  Pt discussed blended family stressors and differences in parenting styles between households.  Pt also discussed relationship w/ boyfriend and his laid back approach and how this stressors her at times w/ comes to fiances and not budgeting like she does. Pt also discussed feeling that worried that things are too good w/ his acceptance of her and go w/ flow of her moods w/out being upset.  Pt discussed racing thoughts and ruminating on worries and working to be mindful and not "hold on" to worries.     Suicidal/Homicidal: Nowithout intent/plan  Therapist Response: Assessed pt current functioning per pt report. Processed w/pt her moods and discussed pt self care and routine.  Explored w/pt stressors of blended family, relationship w/ boyfriend and husband.  Encouraged pt use of mindfulness and wise mind skills from DBT. Plan: Return again in 1 weeks.  Diagnosis: Bipolar 1 D/O    Forde RadonYATES,Fabrizio Filip, [facility] 10/15/2014

## 2014-10-22 ENCOUNTER — Ambulatory Visit (INDEPENDENT_AMBULATORY_CARE_PROVIDER_SITE_OTHER): Admitting: Psychology

## 2014-10-22 DIAGNOSIS — F319 Bipolar disorder, unspecified: Secondary | ICD-10-CM

## 2014-10-22 NOTE — Progress Notes (Signed)
   THERAPIST PROGRESS NOTE  Session Time: 3.30pm-4.18pm  Participation Level: Active  Behavioral Response: Well GroomedAlertAnxious  Type of Therapy: Family Therapy  Treatment Goals addressed: Diagnosis: Bipolar D/O and goal 1.  Interventions: CBT and Supportive  Summary: Gina SenegalKrystal M Barron is a 34 y.o. female who presents with report of continued anxiety and daily panic attacks.  Pt also reports she is fatigued but not sleeping.  Pt reports a lot of anxiety and stress that boyfriend is currently out of job due to loss of license and family income reduced due to this. Pt reported that he seems so laid back and she is stressing about what will happen.  Pt also expressed feeling that he does so much for her w/ her anxiety that it is enabling her.  Boyfriend joined session per her request.  Pt was able to express anxiety she is having and communicate her concerns to boyfriend.  Boyfriend was very understanding and supportive.  Boyfriend was able to share his thoughts of how they are going to manage through- options he sees and also that waiting till court date next week to have information needed to make decisions.  Boyfriend also discussed how he supports her and will give her opportunity to do things on her own- steps in when anxiety is escalating. Pt was able to also to become aware that she has a fear that he is mad at her for not doing things/that he is doing things and that won't last.  Boyfriend provided reassurance and discussed that he chooses to do the things he does, that he cares, not upset about and will be there to support her.  Pt receptive.   Suicidal/Homicidal: Nowithout intent/plan  Therapist Response: Assessed pt current functioning per her report.  Processed w/pt her anxiety and contributing factors.  Supportive of pt boyfriend joining session for pt expressing feeling and communicating.  Reflected feelings shared and assisted pt in recognizing cognitive distortions and  challenging these.  Reflected strength of support and communication between them.  Plan: Return again in 2 weeks.  Diagnosis: Bipolar 1 D/O    Forde RadonYATES,Thos Matsumoto, [facility] 10/22/2014

## 2014-10-29 ENCOUNTER — Ambulatory Visit (HOSPITAL_COMMUNITY): Payer: Self-pay | Admitting: Psychology

## 2014-11-05 ENCOUNTER — Ambulatory Visit (INDEPENDENT_AMBULATORY_CARE_PROVIDER_SITE_OTHER): Admitting: Psychology

## 2014-11-05 DIAGNOSIS — F319 Bipolar disorder, unspecified: Secondary | ICD-10-CM

## 2014-11-05 DIAGNOSIS — F410 Panic disorder [episodic paroxysmal anxiety] without agoraphobia: Secondary | ICD-10-CM

## 2014-11-05 NOTE — Progress Notes (Signed)
   THERAPIST PROGRESS NOTE  Session Time: 3.33pm-4.17pm  Participation Level: Active  Behavioral Response: Well GroomedAlertAnxious  Type of Therapy: Individual Therapy  Treatment Goals addressed: Diagnosis: Bipolar 1, Panic Disorder and goal 1.  Interventions: Supportive and Other: Stress Releasing Movements and Mindfulness  Summary: Gina SenegalKrystal M Barron is a 34 y.o. female who presents with anxious affect and report of severe anxiety recent.  Pt reported she was able to move appointment up to next week w/ Dr. Lolly MustacheArfeen as he had a cancellation. Pt reports she is having 3+ panic attacks daily w/out precipitating factors. Pt also reports she is having spells of laughing when nothing is funny.  Pt reports very stressed by this and becomes tearful stating she doesn't know what to do.  Pt is able to attend in session and is receptive to practices that counselor models- but chooses not to participate in.  Pt calmed in session as counselor modeled.  Pt agrees to use practices for self and connects to them as these are things she is doing unconsciously when in anxiety heightened.  Pt does report stressors of husband needing to talk to her at next visit- concerned what he is going to "drop on her", daughter's denial of pt pregnancy and continued stressors w/ finances and housing.     Suicidal/Homicidal: Nowithout intent/plan  Therapist Response: Assessed pt current functioning per pt report.  Reflected anxiety in session.  Provided psychoeducation re: parasympathetic system and was of accessing to assist in training relaxation response.  Invited pt to join counselor in session different stress releasing movements- shaking joints, rotations of joints, forward bends and use of accupressure points for calming.  Discussed importance of mindfulness and awareness of breath after these movements.  Modeled these for pt.   Discussed how to incorporate daily.    Plan: Return again in 1 weeks. Pt to practice stress  releasing movements.  Pt to f/u as scheduled w/ Dr. Florentina JennyAfreen.  Diagnosis:  Panic Disorder and Bipolar 1 D/O      Harce Volden, [facility] 11/05/2014

## 2014-11-09 ENCOUNTER — Encounter (HOSPITAL_COMMUNITY): Payer: Self-pay | Admitting: Psychiatry

## 2014-11-09 ENCOUNTER — Ambulatory Visit (INDEPENDENT_AMBULATORY_CARE_PROVIDER_SITE_OTHER): Admitting: Psychiatry

## 2014-11-09 VITALS — BP 130/78 | HR 86 | Ht 64.0 in | Wt 148.8 lb

## 2014-11-09 DIAGNOSIS — F3131 Bipolar disorder, current episode depressed, mild: Secondary | ICD-10-CM

## 2014-11-09 DIAGNOSIS — Z3492 Encounter for supervision of normal pregnancy, unspecified, second trimester: Secondary | ICD-10-CM

## 2014-11-09 HISTORY — DX: Encounter for supervision of normal pregnancy, unspecified, second trimester: Z34.92

## 2014-11-09 MED ORDER — SERTRALINE HCL 25 MG PO TABS: 25.0000 mg | ORAL_TABLET | Freq: Every day | ORAL | Status: DC

## 2014-11-09 MED ORDER — LAMOTRIGINE 150 MG PO TABS: 150.0000 mg | ORAL_TABLET | Freq: Every day | ORAL | Status: DC
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
## 2014-11-12 ENCOUNTER — Ambulatory Visit (HOSPITAL_COMMUNITY): Payer: Self-pay | Admitting: Psychology

## 2014-11-12 ENCOUNTER — Encounter (HOSPITAL_COMMUNITY): Payer: Self-pay | Admitting: Psychology

## 2014-11-12 NOTE — Progress Notes (Signed)
[place] [facility] is a 34 y.o. female patient who didn't show for her 1.30pm appointment today.  Counselor called to check on how pt was doing and informed of missed appointment.  Boyfriend informed that they forgot about appointment, but that everything was okay and would come next week.         Forde RadonYATES,Zackarie Chason, LPC

## 2014-11-20 ENCOUNTER — Ambulatory Visit (INDEPENDENT_AMBULATORY_CARE_PROVIDER_SITE_OTHER): Admitting: Psychology

## 2014-11-20 DIAGNOSIS — F319 Bipolar disorder, unspecified: Secondary | ICD-10-CM

## 2014-11-20 DIAGNOSIS — F410 Panic disorder [episodic paroxysmal anxiety] without agoraphobia: Secondary | ICD-10-CM | POA: Diagnosis not present

## 2014-11-20 NOTE — Progress Notes (Signed)
   THERAPIST PROGRESS NOTE  Session Time: 1.30pm-2.18pm  Participation Level: Active  Behavioral Response: Well GroomedAlert, Anxiety and Depressed  Type of Therapy: Individual Therapy  Treatment Goals addressed: Diagnosis: Bipolar 1 D/O and goal 1.  Interventions: CBT, Supportive and Other: Anxiety Releasing Activities  Summary: [place] is a 34 y.o. female who presents with report of continued anxiety w/ panic attacks daily. Pt affect is calm in session.  Pt reported that she is sleeping more- getting 6hours a night and naps during the day w/ use of unisom.  Pt feels that anxiety no benefit from zoloft.  Pt reported that she is becoming concerned about her potential reaction if baby is a female.  Pt expressed how this is impacted by having to females and struggles she has had w/ them.  Pt acknowledged detached from them and feels that may be different if a female.  Pt however aware that she will accept either gender as not in control of this.  Pt reported that she is using the shaking and rolling joint activities more intentionally and helping to not focus on anxiety.  Pt discussed use of rocking and ways she currently is doing forwards folds for comfort.  Pt receptive to poses and movements shown.    Suicidal/Homicidal: Nowithout intent/plan  Therapist Response: Assessed pt current functioning per pt report.  Processed w/ mood since last session and reflected improvement in sleep.  Explored w/ pt stressors re: pregnancy and assisted in awareness of cognitive distortion.  Explored w/pt use of coping skills practices discussed last session and benefits having.  Demonstrated to pt pairing movements w/ breath, using forward folds for grounding.    Plan: Return again in 2 weeks.  Diagnosis: Bipolar D/O and Panic D/O     Camille Dragan, [facility] 11/20/2014

## 2014-11-26 ENCOUNTER — Ambulatory Visit (HOSPITAL_COMMUNITY): Payer: Self-pay | Admitting: Psychiatry

## 2014-12-10 ENCOUNTER — Ambulatory Visit (INDEPENDENT_AMBULATORY_CARE_PROVIDER_SITE_OTHER): Admitting: Psychology

## 2014-12-10 DIAGNOSIS — F319 Bipolar disorder, unspecified: Secondary | ICD-10-CM | POA: Diagnosis not present

## 2014-12-10 NOTE — Progress Notes (Signed)
   THERAPIST PROGRESS NOTE  Session Time: 1.34pm-2.28pm  Participation Level: Active  Behavioral Response: Well GroomedAlertAnxious  Type of Therapy: Individual Therapy  Treatment Goals addressed: Diagnosis: bipolar 1 d/O, anxiety and goal 1.  Interventions: CBT, Strength-based and Supportive  Summary: [place] [facility] is a 34 y.o. female who presents with report of continued anxiety.  Pt reported that she continues to have panic attacks daily.  Pt reports that with Zoloft some of the "edge" is off panic attacks however and so hopes w/ adjustment of dose could see some benefit.  Pt reported that she is prepared to hear sex of the baby and although wants a female would be able to cope w/ female.  Pt reports having more anxiety about the anatomy of the baby- pt reports having ultrasound on 12/14/14.  Pt reports her boyfriend is working past couple days filling in for boss so will have some extra income.  Pt reported that money and difference in management of money still major stressor.  Pt reported feeling in the middle of husband he gives her money and boyfriend whom she lives with and not wanting to upset either for keeping environment peaceful.   Suicidal/Homicidal: Nowithout intent/plan  Therapist Response: ASsessed pt current functioning per pt report.  Processed w/pt her anxiety level and how she has been coping through.  Explored w/pt anxiety provoking situations and how she is approaching w/ self care and establishing boundaries for self.   Plan: Return again in 1 weeks.  Diagnosis: Bipolar 1 D/O   Forde RadonYATES,Rubylee Zamarripa, [facility] 12/10/2014

## 2014-12-14 ENCOUNTER — Encounter (HOSPITAL_COMMUNITY): Payer: Self-pay | Admitting: Psychiatry

## 2014-12-14 ENCOUNTER — Ambulatory Visit (INDEPENDENT_AMBULATORY_CARE_PROVIDER_SITE_OTHER): Admitting: Psychiatry

## 2014-12-14 VITALS — BP 132/80 | HR 91 | Ht 64.0 in | Wt 153.0 lb

## 2014-12-14 DIAGNOSIS — F3189 Other bipolar disorder: Secondary | ICD-10-CM | POA: Diagnosis not present

## 2014-12-14 DIAGNOSIS — F3131 Bipolar disorder, current episode depressed, mild: Secondary | ICD-10-CM

## 2014-12-14 MED ORDER — SERTRALINE HCL 50 MG PO TABS: 50.0000 mg | ORAL_TABLET | Freq: Every day | ORAL | Status: DC

## 2014-12-14 MED ORDER — LAMOTRIGINE 150 MG PO TABS: 150.0000 mg | ORAL_TABLET | Freq: Every day | ORAL | Status: DC
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
## 2014-12-17 ENCOUNTER — Ambulatory Visit (INDEPENDENT_AMBULATORY_CARE_PROVIDER_SITE_OTHER): Admitting: Psychology

## 2014-12-17 DIAGNOSIS — F319 Bipolar disorder, unspecified: Secondary | ICD-10-CM | POA: Diagnosis not present

## 2014-12-17 NOTE — Progress Notes (Signed)
   THERAPIST PROGRESS NOTE  Session Time: 12.32pm-1.23pm  Participation Level: Active  Behavioral Response: Well GroomedAlertAnxious  Type of Therapy: Individual Therapy  Treatment Goals addressed: Diagnosis: Bipolar 1 D/O and goal 1.  Interventions: CBT, Supportive and Other: movement for deesacalation  Summary: [place] is a 34 y.o. female who presents with affect blunted.  Pt reported that she is very tired today.  Pt reported long week w/ appointments for daughter w/ dental needs and her appointments.  Pt reported that she is disappointed that she is having a girl, but reports she will cope with because can't change.  Pt acknowledges that just gender of baby doesn't determine her temperament and relationship.  Pt discussed that she is planning on informing her husband of pregnancy by boyfriend this weekend at her daughter's visit.  Pt reported that she is planning on seeking social services assistance and pregnancy  medicaid for unborn child. Pt reports still feels a lot of anxiety and panic in situations w/ crowds and noises so planning to have supports to assist.  Pt practiced some self massage in session and reports that when focuses on movement w/ breath becomes hyperfocused on breath and panics. Pt agrees to continue using movement for coping.  Suicidal/Homicidal: Nowithout intent/plan  Therapist Response: Assessed pt current functioning per pt report.  Processed w/pt recent stressors and information on having another daughter.  Assisted pt in acknowledging cognitive distortion that another female will equal poor relationship and problems.  Assisted in reframing.  Identified w/ pt postures that feel secure and safe for pt.  Discussed use of movements for calming and introduced pt to self massage for more relaxing movements- practiced in session.    Plan: Return again in 1 weeks.  Diagnosis: Bipolar 1 D/O    Forde RadonYATES,Reeda Soohoo, [facility] 12/17/2014

## 2014-12-24 ENCOUNTER — Ambulatory Visit (INDEPENDENT_AMBULATORY_CARE_PROVIDER_SITE_OTHER): Admitting: Psychology

## 2014-12-24 DIAGNOSIS — F319 Bipolar disorder, unspecified: Secondary | ICD-10-CM | POA: Diagnosis not present
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
## 2015-01-07 ENCOUNTER — Ambulatory Visit (INDEPENDENT_AMBULATORY_CARE_PROVIDER_SITE_OTHER): Admitting: Psychology

## 2015-01-07 DIAGNOSIS — F319 Bipolar disorder, unspecified: Secondary | ICD-10-CM

## 2015-01-07 NOTE — Progress Notes (Signed)
   THERAPIST PROGRESS NOTE  Session Time: 12.33pm-1.18pm  Participation Level: Active  Behavioral Response: Well GroomedAlert, aFFECT WNL  Type of Therapy: Individual Therapy  Treatment Goals addressed: Diagnosis: Bipolar D/O and goal 1.  Interventions: CBT and Supportive  Summary: Gina SenegalKrystal M Barron is a 34 y.o. female who presents with report of feeling tired.  Pt appears tired but is engaged in session.  Pt reported that her anxiety has improved w/ less intense panic attacks, shorter in duration and more awareness when anxiety is increasing.  Pt reported that she went w/ her daughter and boyfriend to beach and had a good time. Pt reported there was a break in the conflict between them which was helpful in reducing her stress. Pt also reported f/u w/ daughter's dental appointments, resolved dental work needed, dentist disagreed w/ previous dentists assessment and pt reported CPS case closed.  Pt reported that she is walking daily w/ daughter which has been beneficial.    Suicidal/Homicidal: Nowithout intent/plan  Therapist Response: Assessed pt current functioning per pt report.  Processed w/pt her mood, stressor and reflected resolve to some recent stressors.  Explored w/ pt some mild improvement in her anxiety and continue w/ use of physical processes to assist w/ managing and coping w/ anxiety.   Plan: Return again in 1 weeks. F/u as scheduled w/ Dr. Lolly MustacheArfeen.  Diagnosis: Bipolar 1 D/O    Forde RadonYATES,Anniemae Haberkorn, [facility] 01/07/2015

## 2015-01-14 ENCOUNTER — Ambulatory Visit (HOSPITAL_COMMUNITY): Payer: Self-pay | Admitting: Psychology

## 2015-01-21 ENCOUNTER — Encounter (HOSPITAL_COMMUNITY): Payer: Self-pay | Admitting: Psychology

## 2015-01-21 ENCOUNTER — Ambulatory Visit (HOSPITAL_COMMUNITY): Payer: Self-pay | Admitting: Psychology

## 2015-01-21 NOTE — Progress Notes (Unsigned)
Gina Barron is a 34 y.o. female patient who didn't show for her appointment today.  No show letter sent.        Forde RadonYATES,Tametha Banning, LPC

## 2015-02-15 ENCOUNTER — Ambulatory Visit (INDEPENDENT_AMBULATORY_CARE_PROVIDER_SITE_OTHER): Admitting: Psychiatry

## 2015-02-15 ENCOUNTER — Encounter (HOSPITAL_COMMUNITY): Payer: Self-pay | Admitting: Psychiatry

## 2015-02-15 VITALS — BP 115/75 | HR 88 | Wt 169.6 lb

## 2015-02-15 DIAGNOSIS — F3181 Bipolar II disorder: Secondary | ICD-10-CM | POA: Diagnosis not present

## 2015-02-15 DIAGNOSIS — F3131 Bipolar disorder, current episode depressed, mild: Secondary | ICD-10-CM

## 2015-02-15 MED ORDER — LAMOTRIGINE 150 MG PO TABS: 150.0000 mg | ORAL_TABLET | Freq: Every day | ORAL | Status: DC

## 2015-02-15 MED ORDER — SERTRALINE HCL 50 MG PO TABS: 50.0000 mg | ORAL_TABLET | Freq: Every day | ORAL | Status: DC
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
## 2015-03-16 ENCOUNTER — Telehealth (HOSPITAL_COMMUNITY): Payer: Self-pay | Admitting: Psychology

## 2015-03-16 ENCOUNTER — Encounter (HOSPITAL_COMMUNITY): Payer: Self-pay | Admitting: Psychology

## 2015-03-16 NOTE — Telephone Encounter (Signed)
Counselor attempted to call to f/u w/ how pt is doing and whether planning to schedule any more counseling appointments.  Her number isn't a valid number.  Will attempt to send letter- unsure if this is correct address.

## 2015-05-18 ENCOUNTER — Ambulatory Visit (INDEPENDENT_AMBULATORY_CARE_PROVIDER_SITE_OTHER): Admitting: Psychiatry

## 2015-05-18 ENCOUNTER — Encounter (HOSPITAL_COMMUNITY): Payer: Self-pay | Admitting: Psychiatry

## 2015-05-18 VITALS — BP 126/86 | HR 76 | Ht 64.5 in | Wt 157.8 lb

## 2015-05-18 DIAGNOSIS — F3131 Bipolar disorder, current episode depressed, mild: Secondary | ICD-10-CM

## 2015-05-18 DIAGNOSIS — F3181 Bipolar II disorder: Secondary | ICD-10-CM

## 2015-05-18 MED ORDER — SERTRALINE HCL 100 MG PO TABS: 100.0000 mg | ORAL_TABLET | Freq: Every day | ORAL | Status: DC

## 2015-05-18 MED ORDER — LAMOTRIGINE 200 MG PO TABS: 200.0000 mg | ORAL_TABLET | Freq: Every day | ORAL | Status: DC
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
## 2015-05-20 ENCOUNTER — Ambulatory Visit (INDEPENDENT_AMBULATORY_CARE_PROVIDER_SITE_OTHER): Admitting: Psychology

## 2015-05-20 ENCOUNTER — Encounter (HOSPITAL_COMMUNITY): Payer: Self-pay | Admitting: Psychology

## 2015-05-20 DIAGNOSIS — F319 Bipolar disorder, unspecified: Secondary | ICD-10-CM

## 2015-05-20 NOTE — Progress Notes (Signed)
   THERAPIST PROGRESS NOTE  Session Time: 1pm-1.45pm  Participation Level: Active  Behavioral Response: Well GroomedAlertDepressed and Tearful  Type of Therapy: Family Therapy  Treatment Goals addressed: Diagnosis: Bipolar D/O Depressed and coping through support systems  Interventions: CBT and Supportive  Summary: Gina Barron is a 34 y.o. female who presents with depressed mood, tearful in session.  Pt presents w/ her boyfriend and their 20 week old daughter, Piper.  Pt expressed she is really struggling w/ depressed moods, panic attacks and lack of sleep.  Pt reported that they were evicted from hotel living at July 7th and now are living w/ her boyfriend's mom and stepfather.  Pt reported that her husband also has cut financial support in half and that haven't been able to secure any help from community programs for housing.  Pt reported that her delivery was also very difficult as didn't feel had good care- nurse stated she didn't know what she was doing and that she had to not push for 4 minutes waiting for doctor.  Pt reported that also her daughter recently ran away and that when boyfriend called into work due to this was fired.  Boyfriend discussed that while his mom is supportive, she will make comments that aren't supportive and that pt can't handle. Boyfriend also reports he has court tomorrow for failure to pay child support and potential of being locked up. Pt expressed not knowing what she will do if case as boyfriend is her sole support. Pt struggles to utilize other support groups available due to anxiety and panic around others.  Pt agrees to keep contact w/ few close friends for support and boyfriend agrees to continue relieve her from caretaking for rest opportunities.     Suicidal/Homicidal: Nowithout intent/plan  Therapist Response: Assessed pt current functioning per pt and partner report. Processed w/pt mood, stressors and validated feelings and transition during  postpartum.  Discussed support system and not isolating from as was as other potential community supports.  Discussed importance of self care w/ rest and reflected strength of boyfriend as support w/ caretaking willingness.   Plan: Return again in 1 weeks. Continue as scheduled w/ Dr. Lolly Mustache.  Call if in crisis or present to ED.   Diagnosis: Bipolar 1 D/O, depressed    Gina Barron,Gina Barron, LPC 05/20/2015

## 2015-07-19 ENCOUNTER — Ambulatory Visit (HOSPITAL_COMMUNITY): Payer: Self-pay | Admitting: Psychiatry

## 2015-07-20 ENCOUNTER — Ambulatory Visit (INDEPENDENT_AMBULATORY_CARE_PROVIDER_SITE_OTHER): Admitting: Psychiatry

## 2015-07-20 ENCOUNTER — Encounter (HOSPITAL_COMMUNITY): Payer: Self-pay | Admitting: Psychiatry

## 2015-07-20 VITALS — BP 112/74 | HR 65 | Ht 64.5 in | Wt 156.8 lb

## 2015-07-20 DIAGNOSIS — F53 Puerperal psychosis: Secondary | ICD-10-CM

## 2015-07-20 DIAGNOSIS — F3131 Bipolar disorder, current episode depressed, mild: Secondary | ICD-10-CM | POA: Diagnosis not present

## 2015-07-20 MED ORDER — HYDROXYZINE PAMOATE 25 MG PO CAPS: ORAL_CAPSULE | ORAL | Status: DC

## 2015-07-20 MED ORDER — SERTRALINE HCL 100 MG PO TABS: 100.0000 mg | ORAL_TABLET | Freq: Every day | ORAL | Status: DC

## 2015-07-20 MED ORDER — LAMOTRIGINE 200 MG PO TABS: 200.0000 mg | ORAL_TABLET | Freq: Every day | ORAL | Status: DC
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
## 2015-09-21 ENCOUNTER — Encounter (HOSPITAL_COMMUNITY): Payer: Self-pay | Admitting: Psychiatry

## 2015-09-21 ENCOUNTER — Ambulatory Visit (INDEPENDENT_AMBULATORY_CARE_PROVIDER_SITE_OTHER): Admitting: Psychiatry

## 2015-09-21 VITALS — BP 102/72 | HR 118 | Ht 64.5 in | Wt 163.0 lb

## 2015-09-21 DIAGNOSIS — F3131 Bipolar disorder, current episode depressed, mild: Secondary | ICD-10-CM

## 2015-09-21 MED ORDER — SERTRALINE HCL 100 MG PO TABS: 100.0000 mg | ORAL_TABLET | Freq: Every day | ORAL | Status: DC

## 2015-09-21 MED ORDER — HYDROXYZINE PAMOATE 50 MG PO CAPS: ORAL_CAPSULE | ORAL | Status: DC

## 2015-09-21 MED ORDER — ZIPRASIDONE HCL 40 MG PO CAPS: 40.0000 mg | ORAL_CAPSULE | Freq: Every day | ORAL | Status: DC
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
## 2015-09-29 ENCOUNTER — Telehealth (HOSPITAL_COMMUNITY): Payer: Self-pay

## 2015-09-29 NOTE — Telephone Encounter (Signed)
Telephone call with patient who reported she had picked up her prescribed Geodon and there were warnings not to breastfeed with use.  Patient stated she then spoke with her OBGY&[facility] and they told her not to take the medication while breastfeeding as well so patient wanted to make Dr. Lolly Mustache aware and to question if he wanted her taking something else?  Agreed to contact patient back once discussed with Dr. Lolly Mustache.  Patient stated a message could be left with her roommate, Barth Kirks if she was not at home with return call.

## 2015-09-29 NOTE — Telephone Encounter (Signed)
Telephone call with patient after discussing with Dr. Lolly Mustache to inform patient to not take Geodon as long as she is breastfeeding and informed her they could discuss further at evaluation set for 10/13/15.  Informed he did not want to start patient on anything similar due to same concern as long as she is breastfeeding and patient agreed with plan to stop Geodon until no longer breastfeeding.  Agrees to keep appointment on 10/13/15 and will call if any problems prior to then.

## 2015-10-05 ENCOUNTER — Telehealth: Payer: Self-pay | Admitting: *Deleted

## 2015-10-05 MED ORDER — HYDROCODONE-ACETAMINOPHEN 7.5-325 MG PO TABS: 1.0000 | ORAL_TABLET | ORAL | Status: DC | PRN

## 2015-10-05 NOTE — Telephone Encounter (Signed)
Rx printed

## 2015-10-05 NOTE — Telephone Encounter (Signed)
Patient called requesting Norco 7.5/325 120. Please advise [phone]

## 2015-10-05 NOTE — Telephone Encounter (Signed)
Prescription available, unable to reach patient, not accepting incoming calls

## 2015-10-12 ENCOUNTER — Encounter (HOSPITAL_COMMUNITY): Payer: Self-pay | Admitting: Psychiatry

## 2015-10-12 ENCOUNTER — Ambulatory Visit (INDEPENDENT_AMBULATORY_CARE_PROVIDER_SITE_OTHER): Admitting: Psychiatry

## 2015-10-12 VITALS — BP 127/83 | HR 91 | Ht 64.5 in | Wt 160.8 lb

## 2015-10-12 DIAGNOSIS — F3131 Bipolar disorder, current episode depressed, mild: Secondary | ICD-10-CM | POA: Diagnosis not present

## 2015-10-12 MED ORDER — HYDROXYZINE PAMOATE 100 MG PO CAPS: ORAL_CAPSULE | ORAL | Status: DC

## 2015-10-12 MED ORDER — SERTRALINE HCL 100 MG PO TABS: 100.0000 mg | ORAL_TABLET | Freq: Every day | ORAL | Status: DC
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
## 2015-11-04 ENCOUNTER — Other Ambulatory Visit: Payer: Self-pay | Admitting: *Deleted

## 2015-11-04 ENCOUNTER — Telehealth: Payer: Self-pay | Admitting: Orthopaedic Surgery

## 2015-11-04 MED ORDER — HYDROCODONE-ACETAMINOPHEN 7.5-325 MG PO TABS: 1.0000 | ORAL_TABLET | ORAL | Status: DC | PRN

## 2015-11-04 NOTE — Telephone Encounter (Signed)
Patient requesting refill of Norco 7.5/325mg   Qty 120 Tablets  1 every 4 hours as needed for moderate pain.

## 2015-11-11 ENCOUNTER — Ambulatory Visit (INDEPENDENT_AMBULATORY_CARE_PROVIDER_SITE_OTHER): Admitting: Orthopaedic Surgery

## 2015-11-11 ENCOUNTER — Encounter: Payer: Self-pay | Admitting: Orthopaedic Surgery

## 2015-11-11 VITALS — BP 119/60 | HR 88 | Temp 97.9°F | Resp 16 | Ht 64.0 in | Wt 158.0 lb

## 2015-11-11 DIAGNOSIS — M7061 Trochanteric bursitis, right hip: Secondary | ICD-10-CM
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
## 2015-11-16 ENCOUNTER — Ambulatory Visit: Payer: Self-pay | Admitting: Orthopaedic Surgery

## 2015-12-06 ENCOUNTER — Telehealth: Payer: Self-pay | Admitting: Orthopaedic Surgery

## 2015-12-06 MED ORDER — HYDROCODONE-ACETAMINOPHEN 5-325 MG PO TABS: 1.0000 | ORAL_TABLET | ORAL | Status: DC | PRN

## 2015-12-06 NOTE — Telephone Encounter (Signed)
Patient called for refill of medication: HYDROcodone-acetaminophen (NORCO) 7.5-325 MG tablet [[id number]][[id number]] - quantity 120; ph# [phone]. Patient also has question - asking what to do about withdrawal, as states without medication since Thursday night, and said was not aware we were closed Friday, 12/03/15.  Please advise.

## 2015-12-06 NOTE — Telephone Encounter (Signed)
Spoke with patient, advised that we are sending message to Dr Hilda LiasKeeling regarding refill. According to our records, refill should've ran out yesterday, patient states she is having withdrawal symptoms which include feeling flu achy. Advised ibuprofen for aches and we will call as soon as prescription is available.

## 2015-12-06 NOTE — Telephone Encounter (Signed)
Rx done. 

## 2015-12-14 ENCOUNTER — Ambulatory Visit (INDEPENDENT_AMBULATORY_CARE_PROVIDER_SITE_OTHER): Admitting: Psychiatry

## 2015-12-14 ENCOUNTER — Encounter (HOSPITAL_COMMUNITY): Payer: Self-pay | Admitting: Psychiatry

## 2015-12-14 VITALS — BP 124/80 | HR 83 | Ht 64.5 in | Wt 159.4 lb

## 2015-12-14 DIAGNOSIS — F3131 Bipolar disorder, current episode depressed, mild: Secondary | ICD-10-CM | POA: Diagnosis not present

## 2015-12-14 MED ORDER — LAMOTRIGINE 25 MG PO TABS: ORAL_TABLET | ORAL | Status: DC

## 2015-12-14 MED ORDER — SERTRALINE HCL 100 MG PO TABS: 100.0000 mg | ORAL_TABLET | Freq: Every day | ORAL | Status: DC

## 2015-12-14 MED ORDER — HYDROXYZINE PAMOATE 100 MG PO CAPS: ORAL_CAPSULE | ORAL | Status: DC
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
## 2016-01-03 ENCOUNTER — Telehealth: Payer: Self-pay | Admitting: Orthopaedic Surgery

## 2016-01-03 MED ORDER — HYDROCODONE-ACETAMINOPHEN 5-325 MG PO TABS: 1.0000 | ORAL_TABLET | ORAL | Status: DC | PRN

## 2016-01-03 NOTE — Telephone Encounter (Signed)
Rx done. 

## 2016-01-03 NOTE — Telephone Encounter (Signed)
Patient called and requested a refill on Norco 5-325 mgs.  Qty 120  Sig: Take 1 tablet by mouth every 4 (four) hours as needed for moderate pain (Must last 30 days.  Do not take and drive a car or use machinery.). °

## 2016-01-10 ENCOUNTER — Encounter (HOSPITAL_COMMUNITY): Payer: Self-pay | Admitting: Emergency Medicine

## 2016-01-10 ENCOUNTER — Emergency Department (HOSPITAL_COMMUNITY)
Admission: EM | Admit: 2016-01-10 | Discharge: 2016-01-10 | Disposition: A | Discharge status: Home / Self Care | Attending: Dermatology | Admitting: Dermatology

## 2016-01-10 ENCOUNTER — Emergency Department (HOSPITAL_COMMUNITY)

## 2016-01-10 DIAGNOSIS — M797 Fibromyalgia: Secondary | ICD-10-CM | POA: Diagnosis not present

## 2016-01-10 DIAGNOSIS — Z5321 Procedure and treatment not carried out due to patient leaving prior to being seen by health care provider: Secondary | ICD-10-CM | POA: Insufficient documentation

## 2016-01-10 DIAGNOSIS — F319 Bipolar disorder, unspecified: Secondary | ICD-10-CM | POA: Diagnosis not present

## 2016-01-10 DIAGNOSIS — R0789 Other chest pain: Secondary | ICD-10-CM | POA: Insufficient documentation

## 2016-01-10 DIAGNOSIS — F17210 Nicotine dependence, cigarettes, uncomplicated: Secondary | ICD-10-CM | POA: Diagnosis not present

## 2016-01-10 DIAGNOSIS — R079 Chest pain, unspecified: Secondary | ICD-10-CM | POA: Diagnosis present

## 2016-01-10 NOTE — ED Notes (Signed)
Pt called for the third time without response from lobby 

## 2016-01-10 NOTE — ED Notes (Signed)
Called to take to treatment room without response from lobby 

## 2016-01-10 NOTE — ED Notes (Signed)
Patient states she has had chest tightness since Friday. Patient also states she has had chest congestion. Denies fever, nausea, vomiting, diarrhea.

## 2016-01-10 NOTE — ED Notes (Signed)
Called for second time without response from lobby 

## 2016-01-10 NOTE — ED Notes (Signed)
Called pt back to triage for blood draw with no response

## 2016-01-11 ENCOUNTER — Ambulatory Visit (INDEPENDENT_AMBULATORY_CARE_PROVIDER_SITE_OTHER): Admitting: Psychology

## 2016-01-11 DIAGNOSIS — F319 Bipolar disorder, unspecified: Secondary | ICD-10-CM

## 2016-01-11 NOTE — Progress Notes (Signed)
   THERAPIST PROGRESS NOTE  Session Time: 3.15pm-4.05pm  Participation Level: Active  Behavioral Response: Well GroomedAlertStressed  Type of Therapy: Individual Therapy  Treatment Goals addressed: Diagnosis: bipolar D/O and goal 1  Interventions: CBT and Strength-based  Summary: Tedra SenegalKrystal M Plaut is a 35 y.o. female who presents with report of significant recent stressors.  Pt reported that she now has a car so can resume counseling for herself.  Pt reported that she was w/out some meds for over a month as couldn't take Geodon as breastfeeding.  She reported struggled w/ a lot of irritability and mood swings.  Pt reported she started back on Lamictal but stopped taking some as began experiencing a lot drowsiness- which she reported stopped when stopped taking but is taking again and dealing w/ drowsiness again.  Pt reports she is working at [employer], they have moved into an apartment and her 13y/o daughter is living w/ them.  Pt reported that her daughter Jeanelle Mallingiper is 298.5 months old and that she did develop attachment w/ her.  Pt reports that relationship w/ boyfriend is not good.  Pt reports she feels annoyed by him- he only works when he wants, spends money on self- against family needs.  Pt reports that he does babysit and help around the house and only benefit in relationship.  Pt identifies that she isn't happy w/ current situation and wants to explore what she wants in life and begin a plan towards this.  Pt reports recent added stressors is boyfriend is in jail for failure to appear court date for child support and then a day later his ex dropped off their 3 daughters for her watch.  Pt identified goals for tx.    Suicidal/Homicidal: Nowithout intent/plan  Therapist Response: Assessed pt current functioning per pt report.  Explored w/pt transitions since last appointment and discussed positives as well as stressors.  Processed w/ pt mood and contributing factors.  Developed tx  plan.  Plan: Return again in 2 weeks.  Diagnosis: Bipolar 1 D/O   Forde RadonYATES,Emersen Mascari, [facility] 01/11/2016

## 2016-02-01 ENCOUNTER — Telehealth: Payer: Self-pay | Admitting: Orthopaedic Surgery

## 2016-02-01 MED ORDER — HYDROCODONE-ACETAMINOPHEN 5-325 MG PO TABS: 1.0000 | ORAL_TABLET | ORAL | Status: DC | PRN

## 2016-02-01 NOTE — Telephone Encounter (Signed)
Hydrocodone-Acetaminophen  5/325mg  Qty 120 Tablets °

## 2016-02-01 NOTE — Telephone Encounter (Signed)
Rx done. 

## 2016-02-07 ENCOUNTER — Ambulatory Visit (HOSPITAL_COMMUNITY): Payer: Self-pay | Admitting: Psychology

## 2016-02-09 ENCOUNTER — Ambulatory Visit (INDEPENDENT_AMBULATORY_CARE_PROVIDER_SITE_OTHER): Admitting: Orthopaedic Surgery

## 2016-02-09 ENCOUNTER — Encounter: Payer: Self-pay | Admitting: Orthopaedic Surgery

## 2016-02-09 VITALS — BP 114/81 | HR 74 | Temp 97.7°F | Ht 64.5 in | Wt 155.6 lb

## 2016-02-09 DIAGNOSIS — M7061 Trochanteric bursitis, right hip: Secondary | ICD-10-CM | POA: Diagnosis not present

## 2016-02-09 MED ORDER — PREDNISONE 10 MG (21) PO TBPK: ORAL_TABLET | ORAL | Status: DC
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
## 2016-02-14 ENCOUNTER — Ambulatory Visit (HOSPITAL_COMMUNITY): Payer: Self-pay | Admitting: Psychiatry

## 2016-02-15 ENCOUNTER — Encounter (HOSPITAL_COMMUNITY): Payer: Self-pay | Admitting: Psychiatry

## 2016-02-15 ENCOUNTER — Ambulatory Visit (INDEPENDENT_AMBULATORY_CARE_PROVIDER_SITE_OTHER): Admitting: Psychiatry

## 2016-02-15 VITALS — BP 118/78 | HR 89 | Ht 64.5 in | Wt 157.6 lb

## 2016-02-15 DIAGNOSIS — F3131 Bipolar disorder, current episode depressed, mild: Secondary | ICD-10-CM | POA: Diagnosis not present

## 2016-02-15 MED ORDER — SERTRALINE HCL 100 MG PO TABS: 100.0000 mg | ORAL_TABLET | Freq: Every day | ORAL | Status: DC

## 2016-02-15 MED ORDER — HYDROXYZINE PAMOATE 100 MG PO CAPS: ORAL_CAPSULE | ORAL | Status: DC

## 2016-02-15 MED ORDER — LAMOTRIGINE 100 MG PO TABS: ORAL_TABLET | ORAL | Status: DC
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
## 2016-02-23 ENCOUNTER — Ambulatory Visit (INDEPENDENT_AMBULATORY_CARE_PROVIDER_SITE_OTHER): Admitting: Psychology

## 2016-02-23 DIAGNOSIS — F3131 Bipolar disorder, current episode depressed, mild: Secondary | ICD-10-CM | POA: Diagnosis not present

## 2016-02-23 NOTE — Progress Notes (Signed)
   THERAPIST PROGRESS NOTE  Session Time: 3.40pm-4.20pm  Participation Level: Active  Behavioral Response: Well GroomedAlerttired  Type of Therapy: Individual Therapy  Treatment Goals addressed: Diagnosis: Bipolar D/O and goal 1  Interventions: CBT and self care  Summary: Gina SenegalKrystal M Barron is a 35 y.o. female who presents with report of feeling tired. 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  Pt reports that she starts her first job at [employer]6am and usually off at 11pm w/ a short break in between. Pt reported that currently working both jobs to support finances this summer.  Pt also reports that her 3 "step daughters" having been staying for a lot of the summer- so the apartment is very crowded. Pt acknowledges need for increased sleep as reports only getting few hours a sleep each night.  Pt discussed benefits and risks of continuing and acknowledged need to set boundaries even though one employer is short staffed.     Suicidal/Homicidal: Nowithout intent/plan  Therapist Response: assessed pt current functioning per pt report.  Processed w/pt her stressors of working and full household and effect having on her wellness.  Discussed need for self care w/ sleep and creating a routine that assists this.  Discussed boundaries she can set to give more opportunity for this.   Plan: Return again in 2 weeks.  Diagnosis: Bipolar1  D/O    Forde RadonYATES,Dayven Linsley, [facility] 02/23/2016

## 2016-03-02 ENCOUNTER — Telehealth: Payer: Self-pay | Admitting: Orthopaedic Surgery

## 2016-03-02 MED ORDER — HYDROCODONE-ACETAMINOPHEN 5-325 MG PO TABS: 1.0000 | ORAL_TABLET | ORAL | Status: DC | PRN

## 2016-03-02 NOTE — Telephone Encounter (Signed)
Hydrocodone-Acetaminophen  5/325mg  Qty 120 Tablets °

## 2016-03-02 NOTE — Telephone Encounter (Signed)
Rx done. 

## 2016-03-08 ENCOUNTER — Encounter (HOSPITAL_COMMUNITY): Payer: Self-pay | Admitting: Psychology

## 2016-03-08 ENCOUNTER — Ambulatory Visit (HOSPITAL_COMMUNITY): Payer: Self-pay | Admitting: Psychology

## 2016-03-14 ENCOUNTER — Encounter (HOSPITAL_COMMUNITY): Payer: Self-pay | Admitting: Psychology

## 2016-03-14 NOTE — Progress Notes (Signed)
Gina Barron is a 35 y.o. female patient who didn't show for her appointment.  Letter sent.        Forde Radon, LPC

## 2016-04-04 ENCOUNTER — Telehealth: Payer: Self-pay | Admitting: Orthopaedic Surgery

## 2016-04-04 MED ORDER — HYDROCODONE-ACETAMINOPHEN 5-325 MG PO TABS: 1.0000 | ORAL_TABLET | ORAL | 0 refills | Status: DC | PRN

## 2016-04-04 NOTE — Telephone Encounter (Signed)
Patient called and requested a refill on Hydrocodone/Acetaminophen 5-325  Mgs.  Qty  120  Sig: Take 1 tablet by mouth every 4 (four) hours as needed for moderate pain (Must last 30 days. Do not take and drive a car or use machinery.).

## 2016-05-03 ENCOUNTER — Telehealth: Payer: Self-pay | Admitting: Orthopaedic Surgery

## 2016-05-03 MED ORDER — HYDROCODONE-ACETAMINOPHEN 5-325 MG PO TABS: 1.0000 | ORAL_TABLET | Freq: Four times a day (QID) | ORAL | 0 refills | Status: DC | PRN

## 2016-05-03 NOTE — Telephone Encounter (Signed)
Patient requests refill: HYDROcodone-acetaminophen (NORCO/VICODIN) 5-325 MG  - insurance is [employer]

## 2016-05-08 ENCOUNTER — Encounter (HOSPITAL_COMMUNITY): Payer: Self-pay | Admitting: *Deleted

## 2016-05-08 ENCOUNTER — Emergency Department (HOSPITAL_COMMUNITY)
Admission: EM | Admit: 2016-05-08 | Discharge: 2016-05-08 | Disposition: A | Discharge status: Home / Self Care | Attending: Emergency Medicine | Admitting: Emergency Medicine

## 2016-05-08 ENCOUNTER — Emergency Department (HOSPITAL_COMMUNITY)

## 2016-05-08 DIAGNOSIS — F17210 Nicotine dependence, cigarettes, uncomplicated: Secondary | ICD-10-CM | POA: Diagnosis not present

## 2016-05-08 DIAGNOSIS — S99922A Unspecified injury of left foot, initial encounter: Secondary | ICD-10-CM | POA: Diagnosis present

## 2016-05-08 DIAGNOSIS — W231XXA Caught, crushed, jammed, or pinched between stationary objects, initial encounter: Secondary | ICD-10-CM | POA: Diagnosis not present

## 2016-05-08 DIAGNOSIS — Y990 Civilian activity done for income or pay: Secondary | ICD-10-CM | POA: Insufficient documentation

## 2016-05-08 DIAGNOSIS — Y9389 Activity, other specified: Secondary | ICD-10-CM | POA: Diagnosis not present

## 2016-05-08 DIAGNOSIS — S92512A Displaced fracture of proximal phalanx of left lesser toe(s), initial encounter for closed fracture: Secondary | ICD-10-CM | POA: Insufficient documentation

## 2016-05-08 DIAGNOSIS — Y929 Unspecified place or not applicable: Secondary | ICD-10-CM | POA: Insufficient documentation

## 2016-05-08 DIAGNOSIS — Z79899 Other long term (current) drug therapy: Secondary | ICD-10-CM | POA: Insufficient documentation

## 2016-05-08 DIAGNOSIS — S92912A Unspecified fracture of left toe(s), initial encounter for closed fracture: Secondary | ICD-10-CM

## 2016-05-08 MED ORDER — OXYCODONE-ACETAMINOPHEN 5-325 MG PO TABS
1.0000 | ORAL_TABLET | Freq: Once | ORAL | Status: AC
  Administered 2016-05-08: 1 via ORAL
  Filled 2016-05-08: qty 1

## 2016-05-08 NOTE — ED Triage Notes (Addendum)
Patient states last night around 20:30 she accidentally slammed her left foot into a shopping cart at work.  Patient states her left small toe was "side ways," but she pulled it back in to place.  Patient now c/o pain and swelling to left lateral foot.  Patient has treated pain with Vicodin, which she takes 4x a day for RA, OA and fibromyalgia, with no relief.
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
## 2016-05-08 NOTE — Discharge Instructions (Signed)
Please follow-up with the orthopedic specialist as previously scheduled. Please use ibuprofen or Tylenol as needed for pain, Ultram as needed for severe pain. Please return if any new or worsening signs or symptoms present.

## 2016-05-10 ENCOUNTER — Encounter: Payer: Self-pay | Admitting: Orthopaedic Surgery

## 2016-05-10 ENCOUNTER — Ambulatory Visit (INDEPENDENT_AMBULATORY_CARE_PROVIDER_SITE_OTHER): Admitting: Orthopaedic Surgery

## 2016-05-10 ENCOUNTER — Ambulatory Visit (INDEPENDENT_AMBULATORY_CARE_PROVIDER_SITE_OTHER)

## 2016-05-10 VITALS — BP 120/72 | HR 63 | Temp 97.3°F | Ht 65.0 in | Wt 149.0 lb

## 2016-05-10 DIAGNOSIS — M2062 Acquired deformities of toe(s), unspecified, left foot: Secondary | ICD-10-CM | POA: Diagnosis not present

## 2016-05-10 DIAGNOSIS — F17210 Nicotine dependence, cigarettes, uncomplicated: Secondary | ICD-10-CM | POA: Diagnosis not present

## 2016-05-10 DIAGNOSIS — M7061 Trochanteric bursitis, right hip: Secondary | ICD-10-CM
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
## 2016-05-10 NOTE — Patient Instructions (Signed)
Smoking Cessation, Tips for Success If you are ready to quit smoking, congratulations! You have chosen to help yourself be healthier. Cigarettes bring nicotine, tar, carbon monoxide, and other irritants into your body. Your lungs, heart, and blood vessels will be able to work better without these poisons. There are many different ways to quit smoking. Nicotine gum, nicotine patches, a nicotine inhaler, or nicotine nasal spray can help with physical craving. Hypnosis, support groups, and medicines help break the habit of smoking. WHAT THINGS CAN I DO TO MAKE QUITTING EASIER?  Here are some tips to help you quit for good:  Pick a date when you will quit smoking completely. Tell all of your friends and family about your plan to quit on that date.  Do not try to slowly cut down on the number of cigarettes you are smoking. Pick a quit date and quit smoking completely starting on that day.  Throw away all cigarettes.   Clean and remove all ashtrays from your home, work, and car.  On a card, write down your reasons for quitting. Carry the card with you and read it when you get the urge to smoke.  Cleanse your body of nicotine. Drink enough water and fluids to keep your urine clear or pale yellow. Do this after quitting to flush the nicotine from your body.  Learn to predict your moods. Do not let a bad situation be your excuse to have a cigarette. Some situations in your life might tempt you into wanting a cigarette.  Never have "just one" cigarette. It leads to wanting another and another. Remind yourself of your decision to quit.  Change habits associated with smoking. If you smoked while driving or when feeling stressed, try other activities to replace smoking. Stand up when drinking your coffee. Brush your teeth after eating. Sit in a different chair when you read the paper. Avoid alcohol while trying to quit, and try to drink fewer caffeinated beverages. Alcohol and caffeine may urge you to  smoke.  Avoid foods and drinks that can trigger a desire to smoke, such as sugary or spicy foods and alcohol.  Ask people who smoke not to smoke around you.  Have something planned to do right after eating or having a cup of coffee. For example, plan to take a walk or exercise.  Try a relaxation exercise to calm you down and decrease your stress. Remember, you may be tense and nervous for the first 2 weeks after you quit, but this will pass.  Find new activities to keep your hands busy. Play with a pen, coin, or rubber band. Doodle or draw things on paper.  Brush your teeth right after eating. This will help cut down on the craving for the taste of tobacco after meals. You can also try mouthwash.   Use oral substitutes in place of cigarettes. Try using lemon drops, carrots, cinnamon sticks, or chewing gum. Keep them handy so they are available when you have the urge to smoke.  When you have the urge to smoke, try deep breathing.  Designate your home as a nonsmoking area.  If you are a heavy smoker, ask your health care provider about a prescription for nicotine chewing gum. It can ease your withdrawal from nicotine.  Reward yourself. Set aside the cigarette money you save and buy yourself something nice.  Look for support from others. Join a support group or smoking cessation program. Ask someone at home or at work to help you with your plan   to quit smoking.  Always ask yourself, "Do I need this cigarette or is this just a reflex?" Tell yourself, "Today, I choose not to smoke," or "I do not want to smoke." You are reminding yourself of your decision to quit.  Do not replace cigarette smoking with electronic cigarettes (commonly called e-cigarettes). The safety of e-cigarettes is unknown, and some may contain harmful chemicals.  If you relapse, do not give up! Plan ahead and think about what you will do the next time you get the urge to smoke. HOW WILL I FEEL WHEN I QUIT SMOKING? You  may have symptoms of withdrawal because your body is used to nicotine (the addictive substance in cigarettes). You may crave cigarettes, be irritable, feel very hungry, cough often, get headaches, or have difficulty concentrating. The withdrawal symptoms are only temporary. They are strongest when you first quit but will go away within 10-14 days. When withdrawal symptoms occur, stay in control. Think about your reasons for quitting. Remind yourself that these are signs that your body is healing and getting used to being without cigarettes. Remember that withdrawal symptoms are easier to treat than the major diseases that smoking can cause.  Even after the withdrawal is over, expect periodic urges to smoke. However, these cravings are generally short lived and will go away whether you smoke or not. Do not smoke! WHAT RESOURCES ARE AVAILABLE TO HELP ME QUIT SMOKING? Your health care provider can direct you to community resources or hospitals for support, which may include:  Group support.  Education.  Hypnosis.  Therapy.   This information is not intended to replace advice given to you by your health care provider. Make sure you discuss any questions you have with your health care provider.   Document Released: 05/05/2004 Document Revised: 08/28/2014 Document Reviewed: 01/23/2013 Elsevier Interactive Patient Education 2016 [employer]  

## 2016-05-15 ENCOUNTER — Telehealth (HOSPITAL_COMMUNITY): Payer: Self-pay

## 2016-05-16 NOTE — Telephone Encounter (Signed)
I R/T HER CALL AND LEFT MESSAGE TO CALL BACK.

## 2016-05-17 ENCOUNTER — Ambulatory Visit (HOSPITAL_COMMUNITY): Payer: Self-pay | Admitting: Psychiatry

## 2016-05-31 ENCOUNTER — Ambulatory Visit (INDEPENDENT_AMBULATORY_CARE_PROVIDER_SITE_OTHER): Admitting: Orthopaedic Surgery

## 2016-05-31 ENCOUNTER — Ambulatory Visit (INDEPENDENT_AMBULATORY_CARE_PROVIDER_SITE_OTHER)

## 2016-05-31 DIAGNOSIS — M797 Fibromyalgia: Secondary | ICD-10-CM | POA: Diagnosis not present

## 2016-05-31 DIAGNOSIS — S92515D Nondisplaced fracture of proximal phalanx of left lesser toe(s), subsequent encounter for fracture with routine healing: Secondary | ICD-10-CM

## 2016-05-31 MED ORDER — HYDROCODONE-ACETAMINOPHEN 5-325 MG PO TABS: 1.0000 | ORAL_TABLET | Freq: Four times a day (QID) | ORAL | 0 refills | Status: DC | PRN
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
## 2016-05-31 NOTE — Patient Instructions (Signed)
Referred to a Rheumatologist for her fibromyalgia

## 2016-06-13 ENCOUNTER — Ambulatory Visit (INDEPENDENT_AMBULATORY_CARE_PROVIDER_SITE_OTHER): Admitting: Psychiatry

## 2016-06-13 ENCOUNTER — Encounter (HOSPITAL_COMMUNITY): Payer: Self-pay | Admitting: Psychiatry

## 2016-06-13 DIAGNOSIS — Z79899 Other long term (current) drug therapy: Secondary | ICD-10-CM | POA: Diagnosis not present

## 2016-06-13 DIAGNOSIS — F3131 Bipolar disorder, current episode depressed, mild: Secondary | ICD-10-CM | POA: Diagnosis not present

## 2016-06-13 MED ORDER — SERTRALINE HCL 100 MG PO TABS: ORAL_TABLET | ORAL | 1 refills | Status: DC

## 2016-06-13 MED ORDER — LAMOTRIGINE 25 MG PO TABS: ORAL_TABLET | ORAL | 1 refills | Status: DC
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
## 2016-06-28 ENCOUNTER — Encounter: Payer: Self-pay | Admitting: Orthopaedic Surgery

## 2016-06-28 ENCOUNTER — Ambulatory Visit (INDEPENDENT_AMBULATORY_CARE_PROVIDER_SITE_OTHER): Admitting: Orthopaedic Surgery

## 2016-06-28 VITALS — BP 125/70 | HR 64 | Temp 97.3°F | Ht 65.0 in | Wt 146.0 lb

## 2016-06-28 DIAGNOSIS — S92515D Nondisplaced fracture of proximal phalanx of left lesser toe(s), subsequent encounter for fracture with routine healing: Secondary | ICD-10-CM

## 2016-06-28 DIAGNOSIS — M797 Fibromyalgia: Secondary | ICD-10-CM
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
## 2016-07-04 ENCOUNTER — Other Ambulatory Visit: Payer: Self-pay | Admitting: Orthopaedic Surgery

## 2016-07-04 MED ORDER — HYDROCODONE-ACETAMINOPHEN 5-325 MG PO TABS
1.0000 | ORAL_TABLET | Freq: Four times a day (QID) | ORAL | 0 refills | Status: DC | PRN
Start: 2016-07-04 — End: 2016-08-02

## 2016-08-02 ENCOUNTER — Telehealth: Payer: Self-pay | Admitting: Orthopedic Surgery

## 2016-08-02 MED ORDER — HYDROCODONE-ACETAMINOPHEN 5-325 MG PO TABS: 1.0000 | ORAL_TABLET | Freq: Four times a day (QID) | ORAL | 0 refills | Status: DC | PRN

## 2016-08-08 ENCOUNTER — Telehealth: Payer: Self-pay | Admitting: Orthopaedic Surgery

## 2016-08-08 MED ORDER — HYDROCODONE-ACETAMINOPHEN 5-325 MG PO TABS: 1.0000 | ORAL_TABLET | Freq: Four times a day (QID) | ORAL | 0 refills | Status: DC | PRN

## 2016-08-08 NOTE — Telephone Encounter (Signed)
Hydrocodone-Acetaminophen  5/325mg  Qty 25 Tablets °

## 2016-08-22 ENCOUNTER — Other Ambulatory Visit: Payer: Self-pay | Admitting: Orthopaedic Surgery

## 2016-09-13 ENCOUNTER — Encounter (HOSPITAL_COMMUNITY): Payer: Self-pay | Admitting: Psychiatry

## 2016-09-13 ENCOUNTER — Ambulatory Visit (INDEPENDENT_AMBULATORY_CARE_PROVIDER_SITE_OTHER): Admitting: Psychiatry

## 2016-09-13 DIAGNOSIS — Z811 Family history of alcohol abuse and dependence: Secondary | ICD-10-CM | POA: Diagnosis not present

## 2016-09-13 DIAGNOSIS — F17210 Nicotine dependence, cigarettes, uncomplicated: Secondary | ICD-10-CM

## 2016-09-13 DIAGNOSIS — Z79899 Other long term (current) drug therapy: Secondary | ICD-10-CM

## 2016-09-13 DIAGNOSIS — Z91040 Latex allergy status: Secondary | ICD-10-CM

## 2016-09-13 DIAGNOSIS — F3131 Bipolar disorder, current episode depressed, mild: Secondary | ICD-10-CM

## 2016-09-13 DIAGNOSIS — Z813 Family history of other psychoactive substance abuse and dependence: Secondary | ICD-10-CM

## 2016-09-13 DIAGNOSIS — Z8261 Family history of arthritis: Secondary | ICD-10-CM | POA: Diagnosis not present

## 2016-09-13 DIAGNOSIS — Z8249 Family history of ischemic heart disease and other diseases of the circulatory system: Secondary | ICD-10-CM | POA: Diagnosis not present

## 2016-09-13 MED ORDER — LAMOTRIGINE 100 MG PO TABS: 100.0000 mg | ORAL_TABLET | Freq: Every day | ORAL | 1 refills | Status: DC

## 2016-09-13 MED ORDER — SERTRALINE HCL 100 MG PO TABS: 100.0000 mg | ORAL_TABLET | Freq: Every day | ORAL | 1 refills | Status: DC
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
## 2016-09-14 ENCOUNTER — Encounter (HOSPITAL_COMMUNITY): Payer: Self-pay | Admitting: Psychology

## 2016-09-14 NOTE — Progress Notes (Signed)
Tedra SenegalKrystal M Knupp is a 36 y.o. female patient discharged from counseling as last seen on 02/23/16.  Outpatient Therapist Discharge Summary  Tedra SenegalKrystal M Bott    Aug 31, 1980   Admission Date: 06/29/14   Discharge Date:  09/14/16 Reason for Discharge:  Not compliant attending counseling Diagnosis:  Bipolar 1 d/O  Comments:  Pt will continue w/ Dr. Epimenio FootArfeen  Kimberlyann Hollar Harrison MonsM Lamar Meter        Jessie Schrieber, [facility]

## 2016-10-06 IMAGING — CR DG CHEST 2V
2 series · 2 of 2 positions shown · non-contrast
Comparison: 06/28/2006

CLINICAL DATA: Cough, shortness of breath, chest tightness

EXAM:
CHEST  2 VIEW

[w chest pa]
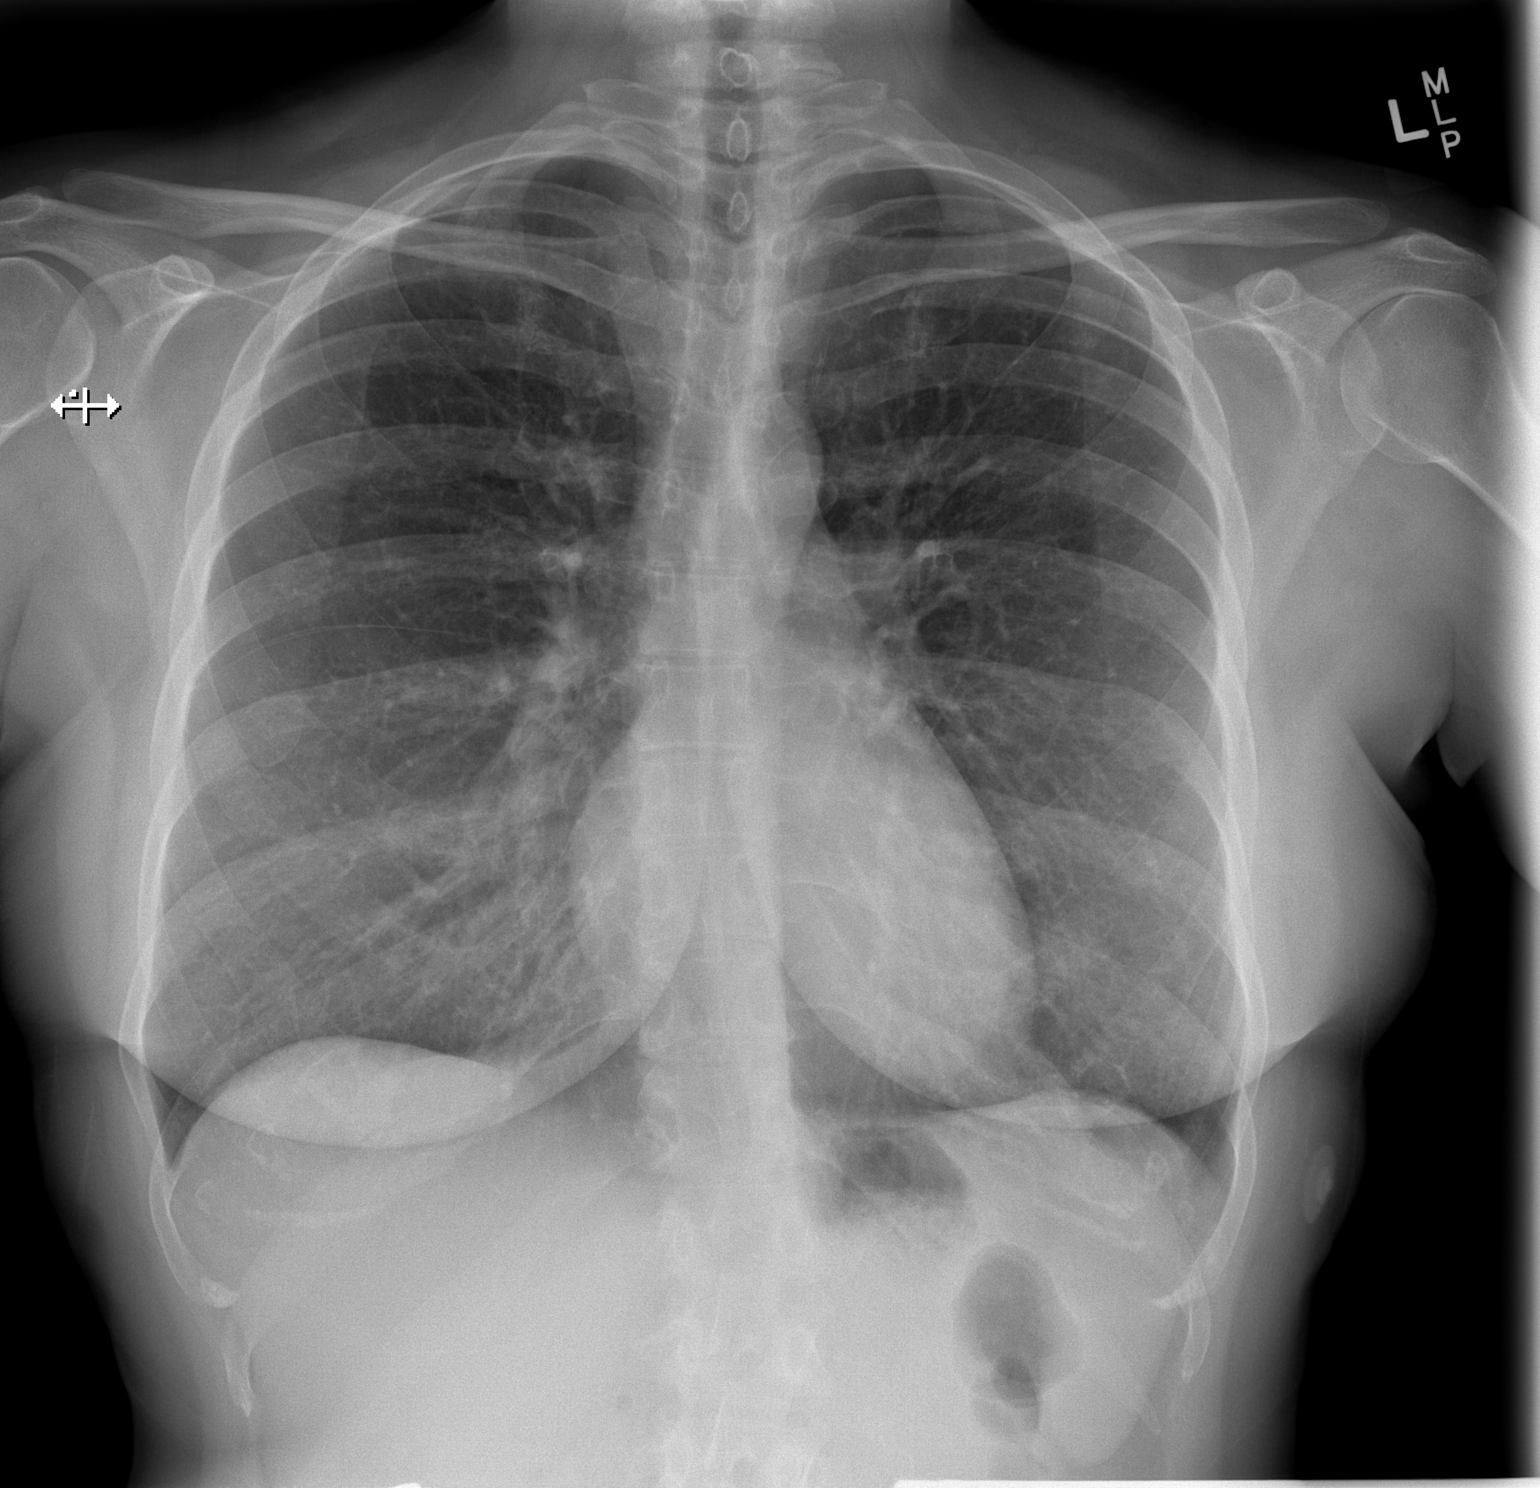

[w chest lat]
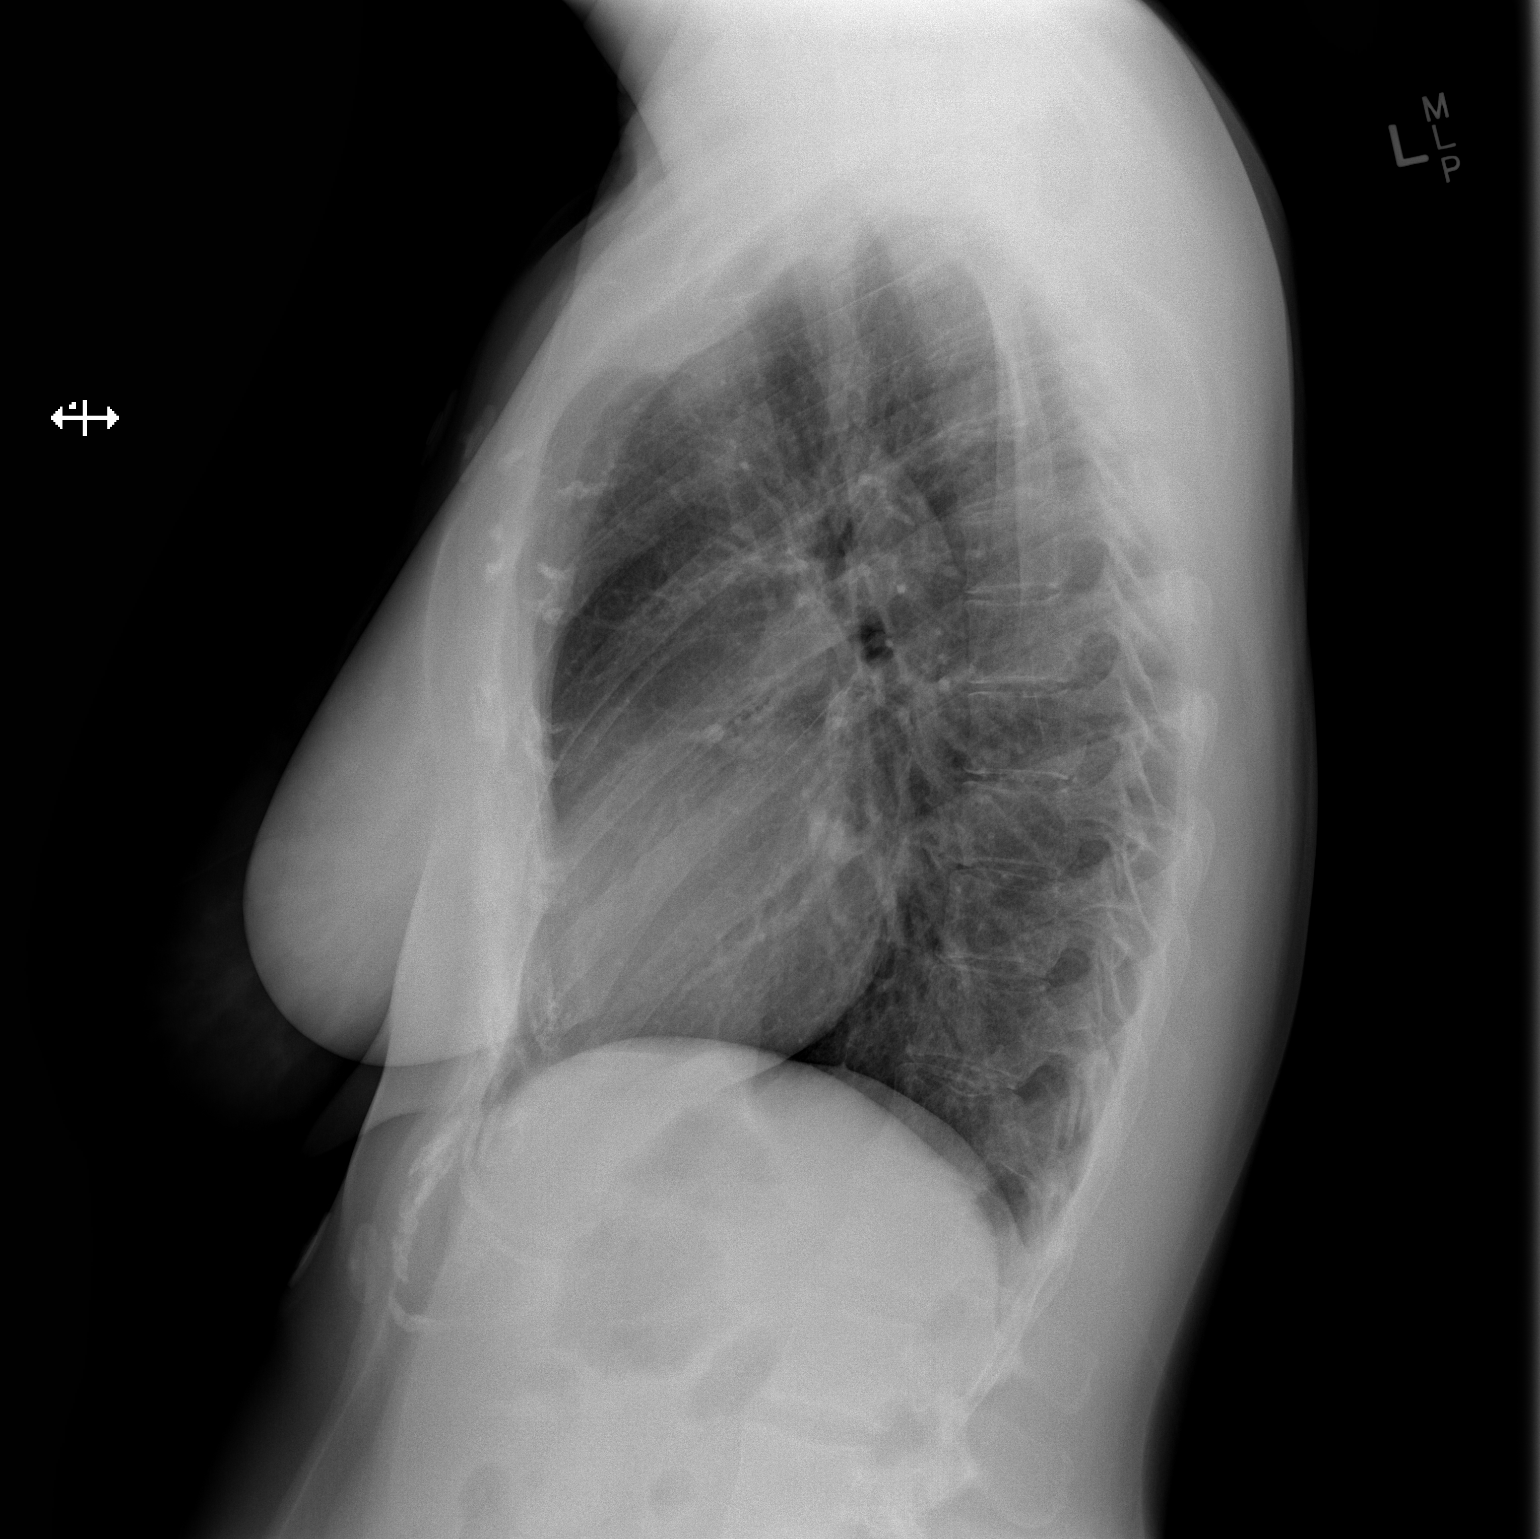

[2 of 2 positions shown; findings below may reference images not displayed]

FINDINGS: Lungs are clear.  No pleural effusion or pneumothorax.

The heart is normal in size.

Visualized osseous structures are within normal limits.
IMPRESSION: Normal chest radiographs.

## 2016-11-15 ENCOUNTER — Ambulatory Visit (HOSPITAL_COMMUNITY): Payer: Self-pay | Admitting: Psychiatry

## 2016-11-28 ENCOUNTER — Ambulatory Visit (INDEPENDENT_AMBULATORY_CARE_PROVIDER_SITE_OTHER): Admitting: Psychiatry

## 2016-11-28 ENCOUNTER — Encounter (HOSPITAL_COMMUNITY): Payer: Self-pay | Admitting: Psychiatry

## 2016-11-28 VITALS — BP 114/70 | HR 82 | Ht 64.5 in | Wt 148.0 lb

## 2016-11-28 DIAGNOSIS — Z79899 Other long term (current) drug therapy: Secondary | ICD-10-CM

## 2016-11-28 DIAGNOSIS — Z811 Family history of alcohol abuse and dependence: Secondary | ICD-10-CM | POA: Diagnosis not present

## 2016-11-28 DIAGNOSIS — F17210 Nicotine dependence, cigarettes, uncomplicated: Secondary | ICD-10-CM | POA: Diagnosis not present

## 2016-11-28 DIAGNOSIS — F3131 Bipolar disorder, current episode depressed, mild: Secondary | ICD-10-CM | POA: Diagnosis not present

## 2016-11-28 DIAGNOSIS — Z818 Family history of other mental and behavioral disorders: Secondary | ICD-10-CM | POA: Diagnosis not present

## 2016-11-28 DIAGNOSIS — Z813 Family history of other psychoactive substance abuse and dependence: Secondary | ICD-10-CM | POA: Diagnosis not present

## 2016-11-28 MED ORDER — QUETIAPINE FUMARATE 50 MG PO TABS: 50.0000 mg | ORAL_TABLET | Freq: Every day | ORAL | 1 refills | Status: DC

## 2016-11-28 MED ORDER — SERTRALINE HCL 100 MG PO TABS: 100.0000 mg | ORAL_TABLET | Freq: Every day | ORAL | 1 refills | Status: DC
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
## 2017-01-11 ENCOUNTER — Encounter (HOSPITAL_COMMUNITY): Payer: Self-pay | Admitting: Psychiatry

## 2017-01-11 ENCOUNTER — Ambulatory Visit (INDEPENDENT_AMBULATORY_CARE_PROVIDER_SITE_OTHER): Admitting: Psychiatry

## 2017-01-11 VITALS — BP 106/70 | HR 70 | Ht 64.0 in | Wt 146.0 lb

## 2017-01-11 DIAGNOSIS — Z818 Family history of other mental and behavioral disorders: Secondary | ICD-10-CM

## 2017-01-11 DIAGNOSIS — F3131 Bipolar disorder, current episode depressed, mild: Secondary | ICD-10-CM

## 2017-01-11 DIAGNOSIS — Z811 Family history of alcohol abuse and dependence: Secondary | ICD-10-CM

## 2017-01-11 DIAGNOSIS — F3181 Bipolar II disorder: Secondary | ICD-10-CM

## 2017-01-11 DIAGNOSIS — Z813 Family history of other psychoactive substance abuse and dependence: Secondary | ICD-10-CM | POA: Diagnosis not present

## 2017-01-11 DIAGNOSIS — F17210 Nicotine dependence, cigarettes, uncomplicated: Secondary | ICD-10-CM

## 2017-01-11 MED ORDER — BREXPIPRAZOLE 1 MG PO TABS: 1.0000 mg | ORAL_TABLET | Freq: Every day | ORAL | 1 refills | Status: DC

## 2017-01-11 MED ORDER — SERTRALINE HCL 100 MG PO TABS: 100.0000 mg | ORAL_TABLET | Freq: Every day | ORAL | 1 refills | Status: DC
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
## 2017-03-14 ENCOUNTER — Ambulatory Visit (INDEPENDENT_AMBULATORY_CARE_PROVIDER_SITE_OTHER): Admitting: Psychiatry

## 2017-03-14 ENCOUNTER — Encounter (HOSPITAL_COMMUNITY): Payer: Self-pay | Admitting: Psychiatry

## 2017-03-14 ENCOUNTER — Telehealth (HOSPITAL_COMMUNITY): Payer: Self-pay

## 2017-03-14 VITALS — BP 120/74 | HR 76 | Ht 64.5 in | Wt 145.2 lb

## 2017-03-14 DIAGNOSIS — F319 Bipolar disorder, unspecified: Secondary | ICD-10-CM

## 2017-03-14 DIAGNOSIS — Z811 Family history of alcohol abuse and dependence: Secondary | ICD-10-CM

## 2017-03-14 DIAGNOSIS — F410 Panic disorder [episodic paroxysmal anxiety] without agoraphobia: Secondary | ICD-10-CM | POA: Diagnosis not present

## 2017-03-14 DIAGNOSIS — Z813 Family history of other psychoactive substance abuse and dependence: Secondary | ICD-10-CM | POA: Diagnosis not present

## 2017-03-14 DIAGNOSIS — F3131 Bipolar disorder, current episode depressed, mild: Secondary | ICD-10-CM | POA: Diagnosis not present

## 2017-03-14 DIAGNOSIS — Z818 Family history of other mental and behavioral disorders: Secondary | ICD-10-CM | POA: Diagnosis not present

## 2017-03-14 DIAGNOSIS — F17210 Nicotine dependence, cigarettes, uncomplicated: Secondary | ICD-10-CM | POA: Diagnosis not present

## 2017-03-14 MED ORDER — ALPRAZOLAM 0.25 MG PO TABS: 0.2500 mg | ORAL_TABLET | Freq: Every day | ORAL | 0 refills | Status: AC | PRN

## 2017-03-14 MED ORDER — SERTRALINE HCL 100 MG PO TABS: 100.0000 mg | ORAL_TABLET | Freq: Every day | ORAL | 1 refills | Status: AC

## 2017-03-14 MED ORDER — BREXPIPRAZOLE 2 MG PO TABS: 2.0000 mg | ORAL_TABLET | Freq: Every day | ORAL | 1 refills | Status: DC

## 2017-03-14 NOTE — Telephone Encounter (Signed)
Patient called and states that when she started the Rexulti, the pharmacy gave her the discounted price - she went to get it today and can not afford it. She would like to know if you can switch her to Abilify. Please review and advise, thank you
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
## 2017-03-15 ENCOUNTER — Other Ambulatory Visit (HOSPITAL_COMMUNITY): Payer: Self-pay

## 2017-03-15 MED ORDER — ARIPIPRAZOLE 5 MG PO TABS: 5.0000 mg | ORAL_TABLET | Freq: Every day | ORAL | 0 refills | Status: DC

## 2017-03-15 MED ORDER — ARIPIPRAZOLE 5 MG PO TABS: 5.0000 mg | ORAL_TABLET | Freq: Every day | ORAL | 0 refills | Status: AC

## 2017-03-15 NOTE — Telephone Encounter (Signed)
We can switch to Abilify 5 mg daily.

## 2017-03-15 NOTE — Telephone Encounter (Signed)
Sent in new order for the Abilify and canceled the Rexulti prescription

## 2017-05-14 ENCOUNTER — Ambulatory Visit (HOSPITAL_COMMUNITY): Payer: Self-pay | Admitting: Psychiatry

## 2018-09-01 ENCOUNTER — Encounter (HOSPITAL_COMMUNITY): Payer: Self-pay | Admitting: Obstetrics and Gynecology

## 2018-09-01 ENCOUNTER — Other Ambulatory Visit: Payer: Self-pay

## 2018-09-01 ENCOUNTER — Emergency Department (HOSPITAL_COMMUNITY)
Admission: EM | Admit: 2018-09-01 | Discharge: 2018-09-01 | Disposition: A | Discharge status: Home / Self Care | Attending: Emergency Medicine | Admitting: Emergency Medicine

## 2018-09-01 DIAGNOSIS — F17210 Nicotine dependence, cigarettes, uncomplicated: Secondary | ICD-10-CM | POA: Diagnosis not present

## 2018-09-01 DIAGNOSIS — F419 Anxiety disorder, unspecified: Secondary | ICD-10-CM | POA: Insufficient documentation

## 2018-09-01 DIAGNOSIS — F319 Bipolar disorder, unspecified: Secondary | ICD-10-CM | POA: Diagnosis not present

## 2018-09-01 DIAGNOSIS — Z91040 Latex allergy status: Secondary | ICD-10-CM | POA: Diagnosis not present

## 2018-09-01 DIAGNOSIS — R21 Rash and other nonspecific skin eruption: Secondary | ICD-10-CM | POA: Insufficient documentation

## 2018-09-01 DIAGNOSIS — Z79899 Other long term (current) drug therapy: Secondary | ICD-10-CM | POA: Insufficient documentation

## 2018-09-01 LAB — COMPREHENSIVE METABOLIC PANEL
ALK PHOS: 54 U/L (ref 38–126)
ALT: 13 U/L (ref 0–44)
AST: 16 U/L (ref 15–41)
Albumin: 4.3 g/dL (ref 3.5–5.0)
Anion gap: 8 (ref 5–15)
BUN: 9 mg/dL (ref 6–20)
CALCIUM: 8.9 mg/dL (ref 8.9–10.3)
CO2: 24 mmol/L (ref 22–32)
CREATININE: 0.77 mg/dL (ref 0.44–1.00)
Chloride: 108 mmol/L (ref 98–111)
GFR calc non Af Amer: >60 mL/min (ref >60)
GLUCOSE: 95 mg/dL (ref 70–99)
Potassium: 4 mmol/L (ref 3.5–5.1)
SODIUM: 140 mmol/L (ref 135–145)
Total Bilirubin: 0.6 mg/dL (ref 0.3–1.2)
Total Protein: 7.2 g/dL (ref 6.5–8.1)

## 2018-09-01 LAB — CBC WITH DIFFERENTIAL/PLATELET
ABS IMMATURE GRANULOCYTES: 0.01 10*3/uL (ref 0.00–0.07)
Basophils Absolute: 0 10*3/uL (ref 0.0–0.1)
Basophils Relative: 1 %
Eosinophils Absolute: 0.2 10*3/uL (ref 0.0–0.5)
Eosinophils Relative: 3 %
HCT: 42.9 % (ref 36.0–46.0)
HEMOGLOBIN: 14.2 g/dL (ref 12.0–15.0)
Immature Granulocytes: 0 %
LYMPHS PCT: 37 %
Lymphs Abs: 2.7 10*3/uL (ref 0.7–4.0)
MCH: 30.4 pg (ref 26.0–34.0)
MCHC: 33.1 g/dL (ref 30.0–36.0)
MCV: 91.9 fL (ref 80.0–100.0)
MONO ABS: 0.7 10*3/uL (ref 0.1–1.0)
Monocytes Relative: 9 %
NEUTROS ABS: 3.8 10*3/uL (ref 1.7–7.7)
Neutrophils Relative %: 50 %
Platelets: 166 10*3/uL (ref 150–400)
RBC: 4.67 MIL/uL (ref 3.87–5.11)
RDW: 12.1 % (ref 11.5–15.5)
WBC: 7.4 10*3/uL (ref 4.0–10.5)
nRBC: 0 % (ref 0.0–0.2)

## 2018-09-01 LAB — LIPASE, BLOOD: Lipase: 32 U/L (ref 11–51)

## 2018-09-01 MED ORDER — ONDANSETRON 8 MG PO TBDP
8.0000 mg | ORAL_TABLET | Freq: Once | ORAL | Status: AC
  Administered 2018-09-01: 8 mg via ORAL
  Filled 2018-09-01: qty 1

## 2018-09-01 MED ORDER — ONDANSETRON HCL 4 MG/2ML IJ SOLN
4.0000 mg | Freq: Once | INTRAMUSCULAR | Status: DC
  Filled 2018-09-01: qty 2

## 2018-09-01 MED ORDER — TRIAMCINOLONE ACETONIDE 0.1 % EX CREA: 1.0000 "application " | TOPICAL_CREAM | Freq: Two times a day (BID) | CUTANEOUS | 1 refills | Status: AC

## 2018-09-01 MED ORDER — SODIUM CHLORIDE 0.9 % IV BOLUS: 1000.0000 mL | Freq: Once | INTRAVENOUS | Status: DC
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
## 2018-09-01 NOTE — ED Notes (Signed)
Pt instructed to change into gown and is now giving urine sample in BR

## 2018-09-01 NOTE — ED Triage Notes (Signed)
Pt reports she has a rash all over her body that started about 6 days ago. The spots are red in appearance and become dry and scaly. Rash visualized on trunk, legs, and arms. Pt denies changing detergents and states she has 7 other people she lives with and none of them have anything similar.

## 2018-09-01 NOTE — Discharge Instructions (Addendum)
Benadryl every 4-6 hours for the itching.  Lukewarm shower.  Prescription for ointment.

## 2020-01-19 ENCOUNTER — Encounter (HOSPITAL_COMMUNITY): Payer: Self-pay | Admitting: Emergency Medicine

## 2020-01-19 ENCOUNTER — Emergency Department (HOSPITAL_COMMUNITY)

## 2020-01-19 ENCOUNTER — Other Ambulatory Visit: Payer: Self-pay

## 2020-01-19 ENCOUNTER — Emergency Department (HOSPITAL_COMMUNITY)
Admission: EM | Admit: 2020-01-19 | Discharge: 2020-01-19 | Disposition: A | Discharge status: Home / Self Care | Attending: Emergency Medicine | Admitting: Emergency Medicine

## 2020-01-19 DIAGNOSIS — U071 COVID-19: Secondary | ICD-10-CM | POA: Diagnosis not present

## 2020-01-19 DIAGNOSIS — R509 Fever, unspecified: Secondary | ICD-10-CM | POA: Diagnosis present

## 2020-01-19 DIAGNOSIS — M797 Fibromyalgia: Secondary | ICD-10-CM | POA: Diagnosis not present

## 2020-01-19 DIAGNOSIS — F17210 Nicotine dependence, cigarettes, uncomplicated: Secondary | ICD-10-CM | POA: Insufficient documentation

## 2020-01-19 LAB — CBC WITH DIFFERENTIAL/PLATELET
Abs Immature Granulocytes: 0.02 10*3/uL (ref 0.00–0.07)
Basophils Absolute: 0 10*3/uL (ref 0.0–0.1)
Basophils Relative: 1 %
Eosinophils Absolute: 0 10*3/uL (ref 0.0–0.5)
Eosinophils Relative: 0 %
HCT: 39.5 % (ref 36.0–46.0)
Hemoglobin: 13.5 g/dL (ref 12.0–15.0)
Immature Granulocytes: 0 %
Lymphocytes Relative: 25 %
Lymphs Abs: 1.4 10*3/uL (ref 0.7–4.0)
MCH: 30.9 pg (ref 26.0–34.0)
MCHC: 34.2 g/dL (ref 30.0–36.0)
MCV: 90.4 fL (ref 80.0–100.0)
Monocytes Absolute: 0.4 10*3/uL (ref 0.1–1.0)
Monocytes Relative: 7 %
Neutro Abs: 3.8 10*3/uL (ref 1.7–7.7)
Neutrophils Relative %: 67 %
Platelets: 118 10*3/uL — ABNORMAL LOW (ref 150–400)
RBC: 4.37 MIL/uL (ref 3.87–5.11)
RDW: 12.3 % (ref 11.5–15.5)
WBC: 5.7 10*3/uL (ref 4.0–10.5)
nRBC: 0 % (ref 0.0–0.2)

## 2020-01-19 LAB — POC SARS CORONAVIRUS 2 AG -  ED: SARS Coronavirus 2 Ag: POSITIVE — AB

## 2020-01-19 LAB — COMPREHENSIVE METABOLIC PANEL
ALT: 15 U/L (ref 0–44)
AST: 22 U/L (ref 15–41)
Albumin: 3.9 g/dL (ref 3.5–5.0)
Alkaline Phosphatase: 49 U/L (ref 38–126)
Anion gap: 7 (ref 5–15)
BUN: 6 mg/dL (ref 6–20)
CO2: 23 mmol/L (ref 22–32)
Calcium: 8.3 mg/dL — ABNORMAL LOW (ref 8.9–10.3)
Chloride: 106 mmol/L (ref 98–111)
Creatinine, Ser: 0.79 mg/dL (ref 0.44–1.00)
GFR calc Af Amer: >60 mL/min (ref >60)
GFR calc non Af Amer: >60 mL/min (ref >60)
Glucose, Bld: 94 mg/dL (ref 70–99)
Potassium: 3.9 mmol/L (ref 3.5–5.1)
Sodium: 136 mmol/L (ref 135–145)
Total Bilirubin: 0.4 mg/dL (ref 0.3–1.2)
Total Protein: 6.9 g/dL (ref 6.5–8.1)

## 2020-01-19 MED ORDER — ACETAMINOPHEN 500 MG PO TABS
500.0000 mg | ORAL_TABLET | Freq: Once | ORAL | Status: AC
  Administered 2020-01-19: 500 mg via ORAL
  Filled 2020-01-19: qty 1

## 2020-01-19 MED ORDER — SODIUM CHLORIDE 0.9 % IV BOLUS
1000.0000 mL | Freq: Once | INTRAVENOUS | Status: AC
  Administered 2020-01-19: 1000 mL via INTRAVENOUS

## 2020-01-19 MED ORDER — ACETAMINOPHEN 500 MG PO TABS: 1000.0000 mg | ORAL_TABLET | Freq: Once | ORAL | Status: DC

## 2020-01-19 NOTE — Discharge Instructions (Addendum)
You are seen today for Covid.  Your labs and chest x-ray were reassuring.  Your temperature was high today, this is because you do have an infection.  I want you to continue to alternate between Tylenol and ibuprofen as you have as prescribed on the bottle.  Make sure to take drink plenty of water.  Use the guide attached.  I want you to isolate for the next 14 days.  This is extremely important.  Come back to the emergency department if you have any worsening symptoms, difficulty breathing, shortness of breath, inability drink water, severe headahce, vision changes,not urinating properly, chest pain, leg swelling, calf tenderness.  . Vitals:   01/19/20 1350 01/19/20 1547  BP: (!) 140/102 115/81  Pulse: (!) 101 90  Resp: 19 16  Temp: (!) 102.7 F (39.3 C) 98.7 F (37.1 C)  SpO2: 99% 100%         Person Under Monitoring Name: Gina Barron  Location: [address] [place] [address]   Infection Prevention Recommendations for Individuals Confirmed to have, or Being Evaluated for, 2019 Novel Coronavirus (COVID-19) Infection Who Receive Care at Home  Individuals who are confirmed to have, or are being evaluated for, COVID-19 should follow the prevention steps below until a healthcare provider or local or state health department says they can return to normal activities.  Stay home except to get medical care You should restrict activities outside your home, except for getting medical care. Do not go to work, school, or public areas, and do not use public transportation or taxis.  Call ahead before visiting your doctor Before your medical appointment, call the healthcare provider and tell them that you have, or are being evaluated for, COVID-19 infection. This will help the healthcare provider's office take steps to keep other people from getting infected. Ask your healthcare provider to call the local or state health department.  Monitor your  symptoms Seek prompt medical attention if your illness is worsening (e.g., difficulty breathing). Before going to your medical appointment, call the healthcare provider and tell them that you have, or are being evaluated for, COVID-19 infection. Ask your healthcare provider to call the local or state health department.  Wear a facemask You should wear a facemask that covers your nose and mouth when you are in the same room with other people and when you visit a healthcare provider. People who live with or visit you should also wear a facemask while they are in the same room with you.  Separate yourself from other people in your home As much as possible, you should stay in a different room from other people in your home. Also, you should use a separate bathroom, if available.  Avoid sharing household items You should not share dishes, drinking glasses, cups, eating utensils, towels, bedding, or other items with other people in your home. After using these items, you should wash them thoroughly with soap and water.  Cover your coughs and sneezes Cover your mouth and nose with a tissue when you cough or sneeze, or you can cough or sneeze into your sleeve. Throw used tissues in a lined trash can, and immediately wash your hands with soap and water for at least 20 seconds or use an alcohol-based hand rub.  Wash your [employer] your hands often and thoroughly with soap and water for at least 20 seconds. You can use an alcohol-based hand sanitizer if soap and water are not available and if your  hands are not visibly dirty. Avoid touching your eyes, nose, and mouth with unwashed hands.   Prevention Steps for Caregivers and Household Members of Individuals Confirmed to have, or Being Evaluated for, COVID-19 Infection Being Cared for in the Home  If you live with, or provide care at home for, a person confirmed to have, or being evaluated for, COVID-19 infection please follow these guidelines  to prevent infection:  Follow healthcare provider's instructions Make sure that you understand and can help the patient follow any healthcare provider instructions for all care.  Provide for the patient's basic needs You should help the patient with basic needs in the home and provide support for getting groceries, prescriptions, and other personal needs.  Monitor the patient's symptoms If they are getting sicker, call his or her medical provider and tell them that the patient has, or is being evaluated for, COVID-19 infection. This will help the healthcare provider's office take steps to keep other people from getting infected. Ask the healthcare provider to call the local or state health department.  Limit the number of people who have contact with the patient If possible, have only one caregiver for the patient. Other household members should stay in another home or place of residence. If this is not possible, they should stay in another room, or be separated from the patient as much as possible. Use a separate bathroom, if available. Restrict visitors who do not have an essential need to be in the home.  Keep older adults, very young children, and other sick people away from the patient Keep older adults, very young children, and those who have compromised immune systems or chronic health conditions away from the patient. This includes people with chronic heart, lung, or kidney conditions, diabetes, and cancer.  Ensure good ventilation Make sure that shared spaces in the home have good air flow, such as from an air conditioner or an opened window, weather permitting.  Wash your hands often Wash your hands often and thoroughly with soap and water for at least 20 seconds. You can use an alcohol based hand sanitizer if soap and water are not available and if your hands are not visibly dirty. Avoid touching your eyes, nose, and mouth with unwashed hands. Use disposable paper towels to  dry your hands. If not available, use dedicated cloth towels and replace them when they become wet.  Wear a facemask and gloves Wear a disposable facemask at all times in the room and gloves when you touch or have contact with the patient's blood, body fluids, and/or secretions or excretions, such as sweat, saliva, sputum, nasal mucus, vomit, urine, or feces.  Ensure the mask fits over your nose and mouth tightly, and do not touch it during use. Throw out disposable facemasks and gloves after using them. Do not reuse. Wash your hands immediately after removing your facemask and gloves. If your personal clothing becomes contaminated, carefully remove clothing and launder. Wash your hands after handling contaminated clothing. Place all used disposable facemasks, gloves, and other waste in a lined container before disposing them with other household waste. Remove gloves and wash your hands immediately after handling these items.  Do not share dishes, glasses, or other household items with the patient Avoid sharing household items. You should not share dishes, drinking glasses, cups, eating utensils, towels, bedding, or other items with a patient who is confirmed to have, or being evaluated for, COVID-19 infection. After the person uses these items, you should wash them thoroughly with  soap and water.  Wash laundry thoroughly Immediately remove and wash clothes or bedding that have blood, body fluids, and/or secretions or excretions, such as sweat, saliva, sputum, nasal mucus, vomit, urine, or feces, on them. Wear gloves when handling laundry from the patient. Read and follow directions on labels of laundry or clothing items and detergent. In general, wash and dry with the warmest temperatures recommended on the label.  Clean all areas the individual has used often Clean all touchable surfaces, such as counters, tabletops, doorknobs, bathroom fixtures, toilets, phones, keyboards, tablets, and bedside  tables, every day. Also, clean any surfaces that may have blood, body fluids, and/or secretions or excretions on them. Wear gloves when cleaning surfaces the patient has come in contact with. Use a diluted bleach solution (e.g., dilute bleach with 1 part bleach and 10 parts water) or a household disinfectant with a label that says EPA-registered for coronaviruses. To make a bleach solution at home, add 1 tablespoon of bleach to 1 quart (4 cups) of water. For a larger supply, add  cup of bleach to 1 gallon (16 cups) of water. Read labels of cleaning products and follow recommendations provided on product labels. Labels contain instructions for safe and effective use of the cleaning product including precautions you should take when applying the product, such as wearing gloves or eye protection and making sure you have good ventilation during use of the product. Remove gloves and wash hands immediately after cleaning.  Monitor yourself for signs and symptoms of illness Caregivers and household members are considered close contacts, should monitor their health, and will be asked to limit movement outside of the home to the extent possible. Follow the monitoring steps for close contacts listed on the symptom monitoring form.   ? If you have additional questions, contact your local health department or call the epidemiologist on call at [phone] (available 24/7). ? This guidance is subject to change. For the most up-to-date guidance from [facility], please refer to their website: [email or web address]
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
## 2020-01-19 NOTE — ED Triage Notes (Signed)
Pt reports since Thursday having fevers, cough and congestion. Feels like she has PNA. Last took Motrin at 11am

## 2020-01-20 ENCOUNTER — Other Ambulatory Visit: Payer: Self-pay | Admitting: Physician Assistant

## 2020-01-20 DIAGNOSIS — F17200 Nicotine dependence, unspecified, uncomplicated: Secondary | ICD-10-CM

## 2020-01-20 DIAGNOSIS — U071 COVID-19: Secondary | ICD-10-CM

## 2020-01-20 DIAGNOSIS — Z6825 Body mass index (BMI) 25.0-25.9, adult: Secondary | ICD-10-CM

## 2020-01-20 MED ORDER — SODIUM CHLORIDE 0.9 % IV SOLN
Freq: Once | INTRAVENOUS | Status: AC
  Filled 2020-01-20: qty 700

## 2020-01-20 NOTE — Progress Notes (Signed)
  I connected by phone with [facility] on 01/20/2020 at 11:32 AM to discuss the potential use of an new treatment for mild to moderate COVID-19 viral infection in non-hospitalized patients.  This patient is a 39 y.o. female that meets the FDA criteria for Emergency Use Authorization of bamlanivimab/etesevimab or casirivimab/imdevimab.  Has a (+) direct SARS-CoV-2 viral test result  Has mild or moderate COVID-19   Is NOT hospitalized due to COVID-19  Is within 10 days of symptom onset  Has at least one of the high risk factor(s) for progression to severe COVID-19 and/or hospitalization as defined in EUA.  Specific high risk criteria : BMI > 25, everyday smoker   I have spoken and communicated the following to the patient or parent/caregiver:  1. FDA has authorized the emergency use of bamlanivimab/etesevimab and casirivimab\imdevimab for the treatment of mild to moderate COVID-19 in adults and pediatric patients with positive results of direct SARS-CoV-2 viral testing who are 67 years of age and older weighing at least 40 kg, and who are at high risk for progressing to severe COVID-19 and/or hospitalization.  2. The significant known and potential risks and benefits of bamlanivimab/etesevimab and casirivimab\imdevimab, and the extent to which such potential risks and benefits are unknown.  3. Information on available alternative treatments and the risks and benefits of those alternatives, including clinical trials.  4. Patients treated with bamlanivimab/etesevimab and casirivimab\imdevimab should continue to self-isolate and use infection control measures (e.g., wear mask, isolate, social distance, avoid sharing personal items, clean and disinfect "high touch" surfaces, and frequent handwashing) according to CDC guidelines.   5. The patient or parent/caregiver has the option to accept or refuse bamlanivimab/etesevimab or casirivimab\imdevimab .  After reviewing this information  with the patient, The patient agreed to proceed with receiving the bamlanivimab/etesevimab infusion and will be provided a copy of the Fact sheet prior to receiving the infusion..  Sx onset 5/27. Set up for infusion tomorrow 6/2 at 10:30am.    Cline Crock 01/20/2020 11:32 AM

## 2020-01-21 ENCOUNTER — Ambulatory Visit (HOSPITAL_COMMUNITY)
Admission: RE | Admit: 2020-01-21 | Discharge: 2020-01-21 | Disposition: A | Discharge status: Home / Self Care | Source: Ambulatory Visit | Attending: Pulmonary Disease | Admitting: Pulmonary Disease

## 2020-01-21 DIAGNOSIS — U071 COVID-19: Secondary | ICD-10-CM | POA: Insufficient documentation

## 2020-01-21 DIAGNOSIS — F17200 Nicotine dependence, unspecified, uncomplicated: Secondary | ICD-10-CM | POA: Insufficient documentation

## 2020-01-21 DIAGNOSIS — Z6825 Body mass index (BMI) 25.0-25.9, adult: Secondary | ICD-10-CM | POA: Diagnosis present

## 2020-01-21 MED ORDER — SODIUM CHLORIDE 0.9 % IV SOLN: INTRAVENOUS | Status: DC | PRN

## 2020-01-21 MED ORDER — METHYLPREDNISOLONE SODIUM SUCC 125 MG IJ SOLR: 125.0000 mg | Freq: Once | INTRAMUSCULAR | Status: DC | PRN

## 2020-01-21 MED ORDER — DIPHENHYDRAMINE HCL 50 MG/ML IJ SOLN: 50.0000 mg | Freq: Once | INTRAMUSCULAR | Status: DC | PRN

## 2020-01-21 MED ORDER — ALBUTEROL SULFATE HFA 108 (90 BASE) MCG/ACT IN AERS: 2.0000 | INHALATION_SPRAY | Freq: Once | RESPIRATORY_TRACT | Status: DC | PRN

## 2020-01-21 MED ORDER — FAMOTIDINE IN NACL 20-0.9 MG/50ML-% IV SOLN: 20.0000 mg | Freq: Once | INTRAVENOUS | Status: DC | PRN

## 2020-01-21 MED ORDER — EPINEPHRINE 0.3 MG/0.3ML IJ SOAJ: 0.3000 mg | Freq: Once | INTRAMUSCULAR | Status: DC | PRN

## 2020-01-21 NOTE — Discharge Instructions (Signed)

## 2020-01-21 NOTE — Progress Notes (Signed)
  Diagnosis: COVID-19  Physician: Dr. Wright  Procedure: [facility] Med: bamlanivimab\etesevimab infusion - Provided patient with bamlanimivab\etesevimab fact sheet for patients, parents and caregivers prior to infusion.  Complications: No immediate complications noted.  Discharge: Discharged home   [facility] 01/21/2020         

## 2020-01-27 ENCOUNTER — Encounter: Payer: Self-pay | Admitting: Unknown Physician Specialty

## 2021-04-04 ENCOUNTER — Encounter (HOSPITAL_COMMUNITY): Payer: Self-pay

## 2021-04-04 ENCOUNTER — Other Ambulatory Visit: Payer: Self-pay

## 2021-04-04 ENCOUNTER — Emergency Department (HOSPITAL_COMMUNITY)
Admission: EM | Admit: 2021-04-04 | Discharge: 2021-04-04 | Disposition: A | Discharge status: Home / Self Care | Attending: Emergency Medicine | Admitting: Emergency Medicine

## 2021-04-04 ENCOUNTER — Emergency Department (HOSPITAL_COMMUNITY)

## 2021-04-04 DIAGNOSIS — S3992XA Unspecified injury of lower back, initial encounter: Secondary | ICD-10-CM | POA: Diagnosis present

## 2021-04-04 DIAGNOSIS — X58XXXA Exposure to other specified factors, initial encounter: Secondary | ICD-10-CM | POA: Diagnosis not present

## 2021-04-04 DIAGNOSIS — S39012A Strain of muscle, fascia and tendon of lower back, initial encounter: Secondary | ICD-10-CM | POA: Diagnosis not present

## 2021-04-04 DIAGNOSIS — F17210 Nicotine dependence, cigarettes, uncomplicated: Secondary | ICD-10-CM | POA: Insufficient documentation

## 2021-04-04 DIAGNOSIS — M79604 Pain in right leg: Secondary | ICD-10-CM | POA: Diagnosis not present

## 2021-04-04 DIAGNOSIS — Z91040 Latex allergy status: Secondary | ICD-10-CM | POA: Insufficient documentation

## 2021-04-04 DIAGNOSIS — M5441 Lumbago with sciatica, right side: Secondary | ICD-10-CM

## 2021-04-04 DIAGNOSIS — T148XXA Other injury of unspecified body region, initial encounter: Secondary | ICD-10-CM

## 2021-04-04 MED ORDER — METHOCARBAMOL 500 MG PO TABS: 500.0000 mg | ORAL_TABLET | Freq: Two times a day (BID) | ORAL | 0 refills | Status: AC

## 2021-04-04 NOTE — ED Provider Notes (Signed)
Emergency Medicine Provider Triage Evaluation Note  Gina [facility] , a 40 y.o. female  was evaluated in triage.  Pt complains of acute on chronic low back pain x2 days that radiates down RLE associated with subjective weakness of RLE and numbness/tingling.  Denies saddle paresthesias, bowel/bladder incontinence, fever/chills, history of IV drug use, history of cancer.  Patient has OA and RA.  No recent trauma.  No past low back surgeries.  Review of Systems  Positive: Low back pain Negative: fever  Physical Exam  There were no vitals taken for this visit. Gen:   Awake, no distress   Resp:  Normal effort  MSK:   Moves extremities without difficulty  Other:  Patient ambulatory in triage  Medical Decision Making  Medically screening exam initiated at 1:14 PM.  Appropriate orders placed.  KANIJAH GROSECLOSE was informed that the remainder of the evaluation will be completed by another provider, this initial triage assessment does not replace that evaluation, and the importance of remaining in the ED until their evaluation is complete.  X-ray ordered Patient ambulatory without difficulty in triage. Low suspicion for cauda equina or central cord compression    Mannie Stabile, PA-C 04/04/21 1318    Derwood Kaplan, MD 04/05/21 1103
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
## 2021-04-04 NOTE — ED Triage Notes (Signed)
Pt reports back pain that radiates down one leg.

## 2021-07-19 ENCOUNTER — Encounter (HOSPITAL_COMMUNITY): Payer: Self-pay

## 2021-07-19 ENCOUNTER — Other Ambulatory Visit: Payer: Self-pay

## 2021-07-19 ENCOUNTER — Emergency Department (HOSPITAL_COMMUNITY)
Admission: EM | Admit: 2021-07-19 | Discharge: 2021-07-20 | Disposition: A | Discharge status: Home / Self Care | Attending: Emergency Medicine | Admitting: Emergency Medicine

## 2021-07-19 DIAGNOSIS — Z91040 Latex allergy status: Secondary | ICD-10-CM | POA: Diagnosis not present

## 2021-07-19 DIAGNOSIS — R5383 Other fatigue: Secondary | ICD-10-CM | POA: Diagnosis not present

## 2021-07-19 DIAGNOSIS — I10 Essential (primary) hypertension: Secondary | ICD-10-CM | POA: Insufficient documentation

## 2021-07-19 DIAGNOSIS — F17210 Nicotine dependence, cigarettes, uncomplicated: Secondary | ICD-10-CM | POA: Insufficient documentation

## 2021-07-19 DIAGNOSIS — Z20822 Contact with and (suspected) exposure to covid-19: Secondary | ICD-10-CM | POA: Insufficient documentation

## 2021-07-19 LAB — CBC WITH DIFFERENTIAL/PLATELET
Abs Immature Granulocytes: 0.06 10*3/uL (ref 0.00–0.07)
Basophils Absolute: 0.1 10*3/uL (ref 0.0–0.1)
Basophils Relative: 1 %
Eosinophils Absolute: 0.2 10*3/uL (ref 0.0–0.5)
Eosinophils Relative: 2 %
HCT: 42.9 % (ref 36.0–46.0)
Hemoglobin: 14.6 g/dL (ref 12.0–15.0)
Immature Granulocytes: 1 %
Lymphocytes Relative: 26 %
Lymphs Abs: 3.3 10*3/uL (ref 0.7–4.0)
MCH: 30.8 pg (ref 26.0–34.0)
MCHC: 34 g/dL (ref 30.0–36.0)
MCV: 90.5 fL (ref 80.0–100.0)
Monocytes Absolute: 0.7 10*3/uL (ref 0.1–1.0)
Monocytes Relative: 6 %
Neutro Abs: 8.2 10*3/uL — ABNORMAL HIGH (ref 1.7–7.7)
Neutrophils Relative %: 64 %
Platelets: 197 10*3/uL (ref 150–400)
RBC: 4.74 MIL/uL (ref 3.87–5.11)
RDW: 12.7 % (ref 11.5–15.5)
WBC: 12.5 10*3/uL — ABNORMAL HIGH (ref 4.0–10.5)
nRBC: 0 % (ref 0.0–0.2)

## 2021-07-19 LAB — TSH: TSH: 1.881 u[IU]/mL (ref 0.350–4.500)

## 2021-07-19 LAB — RESP PANEL BY RT-PCR (FLU A&B, COVID) ARPGX2
Influenza A by PCR: NEGATIVE
Influenza B by PCR: NEGATIVE
SARS Coronavirus 2 by RT PCR: NEGATIVE

## 2021-07-19 LAB — BASIC METABOLIC PANEL
Anion gap: 9 (ref 5–15)
BUN: 11 mg/dL (ref 6–20)
CO2: 22 mmol/L (ref 22–32)
Calcium: 9.4 mg/dL (ref 8.9–10.3)
Chloride: 104 mmol/L (ref 98–111)
Creatinine, Ser: 0.81 mg/dL (ref 0.44–1.00)
GFR, Estimated: >60 mL/min (ref >60)
Glucose, Bld: 104 mg/dL — ABNORMAL HIGH (ref 70–99)
Potassium: 3.5 mmol/L (ref 3.5–5.1)
Sodium: 135 mmol/L (ref 135–145)

## 2021-07-19 NOTE — ED Provider Notes (Signed)
Emergency Medicine Provider Triage Evaluation Note  Gina Barron , a 40 y.o. female  was evaluated in triage.  Pt complains of hypertension.  Was seen by primary care doctor earlier this week, was elevated in the office but did not start antihypertensive medicine yet wrist blood pressure cuff to wear.  He has been feeling weak for about a month, denies any chest pain, shortness of breath, new headache or new vision changes.  No nausea or vomiting.  Reports she does have an enlarged thyroid, does not take any medicine for it and just had labs drawn on Monday to check thyroid function.  She does not have any personal history of hyperthyroidism, hypothyroidism or medullary thyroid cancer..  Review of Systems  Positive: Weakness Negative: CP  Physical Exam  BP (!) 202/113 (BP Location: Left Arm)   Pulse (!) 112   Temp 98.1 F (36.7 C) (Oral)   Resp 18   Ht 5\' 4"  (1.626 m)   Wt 79.4 kg   SpO2 96%   BMI 30.04 kg/m  Gen:   Awake, no distress   Resp:  Normal effort  MSK:   Moves extremities without difficulty  Other:  Patient is tachycardic, lungs are clear to auscultation bilaterally  Medical Decision Making  Medically screening exam initiated at 8:19 PM.  Appropriate orders placed.  Gina Barron was informed that the remainder of the evaluation will be completed by another provider, this initial triage assessment does not replace that evaluation, and the importance of remaining in the ED until their evaluation is complete.  Will check BMP and CBC for signs of renal impairment or signs of infectious process.  EKG ordered elevated blood pressure and tachycardia, full sounds regular to doubt arrhythmia.  TSH and T4 ordered due to nebulous thyroid history, unclear if it is contributory.   Gina Senegal, PA-C 07/19/21 2021    2022, DO 07/21/21 0901

## 2021-07-19 NOTE — ED Triage Notes (Addendum)
Pt states that she hasn't been feeling well, went to her primary, and her blood pressure was elevated. (Monday). Pt states that she has been taking her blood pressures at home and she has been elevated. Pt reports having high levels of stress.

## 2021-07-20 LAB — T4, FREE: Free T4: 0.74 ng/dL (ref 0.61–1.12)

## 2021-07-20 NOTE — Discharge Instructions (Signed)
Record your blood pressure and follow up with your doctor. Work to limit things that increase blood pressure- smoking, caffeine intake, stress. Try to set a walking goal, even 5 minutes daily can help lower stress and blood pressure.  Return to the ER for chest pain, shortness of breath, visual changes, concerning symptoms.
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
## 2021-09-09 ENCOUNTER — Other Ambulatory Visit: Payer: Self-pay

## 2021-09-09 ENCOUNTER — Ambulatory Visit
Admission: RE | Admit: 2021-09-09 | Discharge: 2021-09-09 | Disposition: A | Discharge status: Home / Self Care | Source: Ambulatory Visit | Attending: Physician Assistant | Admitting: Physician Assistant

## 2021-09-09 VITALS — BP 111/71 | HR 97 | Temp 99.7°F | Resp 18

## 2021-09-09 DIAGNOSIS — J029 Acute pharyngitis, unspecified: Secondary | ICD-10-CM

## 2021-09-09 LAB — POCT INFLUENZA A/B
Influenza A, POC: NEGATIVE
Influenza B, POC: NEGATIVE

## 2021-09-09 MED ORDER — AMOXICILLIN-POT CLAVULANATE 875-125 MG PO TABS: 1.0000 | ORAL_TABLET | Freq: Two times a day (BID) | ORAL | 0 refills | Status: DC

## 2021-09-09 MED ORDER — KETOROLAC TROMETHAMINE 30 MG/ML IJ SOLN
30.0000 mg | Freq: Once | INTRAMUSCULAR | Status: AC
  Administered 2021-09-09: 30 mg via INTRAMUSCULAR
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
## 2021-09-09 NOTE — ED Triage Notes (Signed)
Two day h/o HA, fever, body aches and sore throat with dysphagia. Tmax 103.0. Has tried otc meds w/o relief. Confirms diarrhea and decreased appetite which she attributes to throat pain. No nausea and emesis. Negative at home covid tests. Pt reports h/o long covid.

## 2021-09-09 NOTE — Discharge Instructions (Signed)
°  Please avoid any NSAIDS (ibuprofen, motrin, aleve, naproxen) for the next 24 hours. Follow up if no improvement or if symptoms worsen in any way.

## 2021-09-15 ENCOUNTER — Ambulatory Visit
Admission: RE | Admit: 2021-09-15 | Discharge: 2021-09-15 | Disposition: A | Discharge status: Home / Self Care | Source: Ambulatory Visit | Attending: Emergency Medicine | Admitting: Emergency Medicine

## 2021-09-15 ENCOUNTER — Other Ambulatory Visit: Payer: Self-pay

## 2021-09-15 VITALS — BP 160/91 | HR 78 | Temp 98.0°F | Resp 16

## 2021-09-15 DIAGNOSIS — K0520 Aggressive periodontitis, unspecified: Secondary | ICD-10-CM | POA: Diagnosis not present

## 2021-09-15 MED ORDER — PENICILLIN V POTASSIUM 500 MG PO TABS: 500.0000 mg | ORAL_TABLET | Freq: Three times a day (TID) | ORAL | 0 refills | Status: AC

## 2021-09-15 MED ORDER — METRONIDAZOLE 500 MG PO TABS: 500.0000 mg | ORAL_TABLET | Freq: Three times a day (TID) | ORAL | 0 refills | Status: AC

## 2021-09-15 NOTE — Discharge Instructions (Signed)
Please stop Augmentin now.  Please begin penicillin 1 tablet 3 times daily for 14 days.  Please also begin metronidazole 1 tablet 3 times daily for 14 days.  You to take both of these medications at the same time.  These medications can be taken with or without food.  I am glad you already have a dental plan set up for April.  I do recommend that she reach out to your dental surgeon sooner than that to let them know about your infection and the antibiotics that were prescribed for you.  For visiting urgent care today.  I appreciate the opportunity to participate in your care.
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
## 2021-09-15 NOTE — ED Triage Notes (Signed)
Here last week for "a combination of flu, strep, and covid" and after asking patient if she tested positive for any of those, she said no. Says she took the antibiotics they prescribed for her here last time helped. She says she almost finished them, and now her symptoms are getting worse and is having fevers up to 103 at home. Reports taking tylenol and ibuprofen to keep fevers down.

## 2023-07-01 ENCOUNTER — Ambulatory Visit
